# Patient Record
Sex: Female | Born: 1985 | Race: Black or African American | Hispanic: No | Marital: Married | Smoking: Never smoker
Health system: Southern US, Community
[De-identification: ages and names within clinical notes are randomized; demographics above are authoritative.]

## PROBLEM LIST (undated history)

## (undated) ENCOUNTER — Inpatient Hospital Stay (HOSPITAL_COMMUNITY): Payer: Self-pay

## (undated) DIAGNOSIS — O24419 Gestational diabetes mellitus in pregnancy, unspecified control: Secondary | ICD-10-CM

## (undated) DIAGNOSIS — Z8632 Personal history of gestational diabetes: Secondary | ICD-10-CM

## (undated) HISTORY — PX: NO PAST SURGERIES: SHX2092

## (undated) HISTORY — DX: Gestational diabetes mellitus in pregnancy, unspecified control: O24.419

## (undated) HISTORY — DX: Personal history of gestational diabetes: Z86.32

---
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
## 2007-08-28 ENCOUNTER — Emergency Department (HOSPITAL_COMMUNITY): Admission: EM | Admit: 2007-08-28 | Discharge: 2007-08-28 | Payer: Self-pay | Admitting: Emergency Medicine

## 2010-12-03 ENCOUNTER — Emergency Department (HOSPITAL_COMMUNITY): Payer: No Typology Code available for payment source

## 2010-12-03 ENCOUNTER — Emergency Department (HOSPITAL_COMMUNITY)
Admission: EM | Admit: 2010-12-03 | Discharge: 2010-12-03 | Disposition: A | Discharge status: Home / Self Care | Payer: No Typology Code available for payment source | Attending: Emergency Medicine | Admitting: Emergency Medicine

## 2010-12-03 DIAGNOSIS — R51 Headache: Secondary | ICD-10-CM | POA: Insufficient documentation

## 2010-12-03 DIAGNOSIS — T1490XA Injury, unspecified, initial encounter: Secondary | ICD-10-CM | POA: Insufficient documentation

## 2010-12-03 DIAGNOSIS — M545 Low back pain, unspecified: Secondary | ICD-10-CM | POA: Insufficient documentation

## 2010-12-03 DIAGNOSIS — Y92410 Unspecified street and highway as the place of occurrence of the external cause: Secondary | ICD-10-CM | POA: Insufficient documentation

## 2011-07-10 LAB — MONONUCLEOSIS SCREEN: Mono Screen: POSITIVE — AB

## 2011-07-10 LAB — STREP A DNA PROBE: Group A Strep Probe: NEGATIVE

## 2013-10-01 NOTE — L&D Delivery Note (Signed)
Delivery Note At 9:02 PM a viable and healthy female was delivered via Vaginal, Spontaneous Delivery (Presentation: Left Occiput Anterior).  APGAR: , ; weight  .   Placenta status: Intact, Spontaneous.  Cord: 3 vessels with the following complications: None.   Nuchal cord x 1, loose, reduced  Anesthesia: Epidural  Episiotomy: None Lacerations: Labial Suture Repair: 4-0 Monocryl Est. Blood Loss (mL):  60   Mom to postpartum.  Baby to Couplet care / Skin to Skin.  [facility] CenterWILLIAMS,Phyllis [name], 9:23 PM

## 2014-01-18 ENCOUNTER — Inpatient Hospital Stay (HOSPITAL_COMMUNITY)
Admission: AD | Admit: 2014-01-18 | Discharge: 2014-01-18 | Disposition: A | Discharge status: Home / Self Care | Payer: 59 | Source: Ambulatory Visit | Attending: Obstetrics & Gynecology | Admitting: Obstetrics & Gynecology

## 2014-01-18 ENCOUNTER — Encounter (HOSPITAL_COMMUNITY): Payer: Self-pay

## 2014-01-18 ENCOUNTER — Inpatient Hospital Stay (HOSPITAL_COMMUNITY): Payer: 59

## 2014-01-18 DIAGNOSIS — O9989 Other specified diseases and conditions complicating pregnancy, childbirth and the puerperium: Secondary | ICD-10-CM

## 2014-01-18 DIAGNOSIS — Z87891 Personal history of nicotine dependence: Secondary | ICD-10-CM | POA: Insufficient documentation

## 2014-01-18 DIAGNOSIS — R109 Unspecified abdominal pain: Secondary | ICD-10-CM | POA: Insufficient documentation

## 2014-01-18 DIAGNOSIS — O99891 Other specified diseases and conditions complicating pregnancy: Secondary | ICD-10-CM | POA: Insufficient documentation

## 2014-01-18 DIAGNOSIS — O26899 Other specified pregnancy related conditions, unspecified trimester: Secondary | ICD-10-CM

## 2014-01-18 LAB — HCG, QUANTITATIVE, PREGNANCY: HCG, BETA CHAIN, QUANT, S: 250 m[IU]/mL — AB (ref <5)

## 2014-01-18 LAB — ABO/RH: ABO/RH(D): A POS

## 2014-01-18 LAB — CBC
HCT: 34.1 % — ABNORMAL LOW (ref 36.0–46.0)
HEMOGLOBIN: 11.2 g/dL — AB (ref 12.0–15.0)
MCH: 23.2 pg — ABNORMAL LOW (ref 26.0–34.0)
MCHC: 32.8 g/dL (ref 30.0–36.0)
MCV: 70.6 fL — ABNORMAL LOW (ref 78.0–100.0)
Platelets: 293 10*3/uL (ref 150–400)
RBC: 4.83 MIL/uL (ref 3.87–5.11)
RDW: 15.2 % (ref 11.5–15.5)
WBC: 9 10*3/uL (ref 4.0–10.5)

## 2014-01-18 LAB — POCT PREGNANCY, URINE: PREG TEST UR: POSITIVE — AB

## 2014-01-18 LAB — WET PREP, GENITAL
CLUE CELLS WET PREP: NONE SEEN
TRICH WET PREP: NONE SEEN
YEAST WET PREP: NONE SEEN

## 2014-01-18 NOTE — MAU Note (Signed)
Pt states had two positive pregnancy tests. Is here for confirmation. Denies pain or abnormal vaginal discharge. No bleeding. LMP-12/11/2013.

## 2014-01-18 NOTE — MAU Provider Note (Signed)
History     CSN: [id number]  Arrival date and time: 01/18/14 1205   First Provider Initiated Contact with Patient 01/18/14 1408      Chief Complaint  Patient presents with  . Pregnancy confirmation    HPI Phyllis [name] is a 28 y.o. G1P0 at G1P0 who presents to MAU today with complaint of lower abdominal cramping and +HPT. The patient denies vaginal bleeding, abnormal discharge, N/V/D or fever. LMP 12/11/13.   OB History   Grav Para Term Preterm Abortions TAB SAB Ect Mult Living   1               No past medical history on file.  No past surgical history on file.  No family history on file.  History  Substance Use Topics  . Smoking status: Not on file  . Smokeless tobacco: Not on file  . Alcohol Use: Not on file    Allergies: No Known Allergies  Prescriptions prior to admission  Medication Sig Dispense Refill  . ibuprofen (ADVIL,MOTRIN) 200 MG tablet Take 600 mg by mouth every 6 (six) hours as needed.        Review of Systems  Constitutional: Negative for fever and malaise/fatigue.  Gastrointestinal: Positive for abdominal pain. Negative for nausea, vomiting, diarrhea and constipation.  Genitourinary:       Neg - vaginal bleeding, discharge   Physical Exam   Blood pressure 118/84, pulse 87, temperature 99 F (37.2 C), temperature source Oral, resp. rate 16, height 4\' 11"  (1.499 m), weight 45.416 kg (100 lb 2 oz), last menstrual period 12/11/2013.  Physical Exam  Constitutional: She is oriented to person, place, and time. She appears well-developed and well-nourished. No distress.  HENT:  Head: Normocephalic and atraumatic.  Cardiovascular: Normal rate.   Respiratory: Effort normal.  GI: Soft. She exhibits no distension and no mass. There is no tenderness. There is no rebound and no guarding.  Genitourinary: No bleeding around the vagina. Vaginal discharge (scant thin white discharge noted) found.  Neurological: She is alert and oriented to person,  place, and time.  Skin: Skin is warm and dry. No erythema.  Psychiatric: She has a normal mood and affect.   Results for orders placed during the hospital encounter of 01/18/14 (from the past 24 hour(s))  POCT PREGNANCY, URINE     Status: Abnormal   Collection Time    01/18/14  1:03 PM      Result Value Ref Range   Preg Test, Ur POSITIVE (*) NEGATIVE  HCG, QUANTITATIVE, PREGNANCY     Status: Abnormal   Collection Time    01/18/14  2:05 PM      Result Value Ref Range   hCG, Beta Chain, Quant, S 250 (*) <5 mIU/mL  CBC     Status: Abnormal   Collection Time    01/18/14  2:05 PM      Result Value Ref Range   WBC 9.0  4.0 - 10.5 K/uL   RBC 4.83  3.87 - 5.11 MIL/uL   Hemoglobin 11.2 (*) 12.0 - 15.0 g/dL   HCT 40.934.1 (*) 81.136.0 - 91.446.0 %   MCV 70.6 (*) 78.0 - 100.0 fL   MCH 23.2 (*) 26.0 - 34.0 pg   MCHC 32.8  30.0 - 36.0 g/dL   RDW 78.215.2  95.611.5 - 21.315.5 %   Platelets 293  150 - 400 K/uL  WET PREP, GENITAL     Status: Abnormal   Collection Time    01/18/14  2:30 PM      Result Value Ref Range   Yeast Wet Prep HPF POC NONE SEEN  NONE SEEN   Trich, Wet Prep NONE SEEN  NONE SEEN   Clue Cells Wet Prep HPF POC NONE SEEN  NONE SEEN   WBC, Wet Prep HPF POC RARE (*) NONE SEEN    [place] Ob Comp Less 14 Wks  01/18/2014   CLINICAL DATA:  Lower abdominal pain.  EXAM: OBSTETRIC <14 WK US AND TRANSVAGINAL OB US  TECHNIQUE: Both transabdominal and transvaginal ultrasound examinations were performed for complete evaluation of the gestation as well as the maternal uterus, adnexal regions, and pelvic cul-de-sac. Transvaginal technique was performed to assess early pregnancy.  COMPARISON:  None.  FINDINGS: Intrauterine gestational sac: Visualized/normal in shape.  Yolk sac:  None visualized  Embryo:  None visualized  Maternal uterus/adnexae: Endometrium approximately 15 mm in thickness. No ovarian or adnexal masses. Ovaries are symmetric in size and echotexture. Trace free fluid in the cul-de-sac.  IMPRESSION: No  intrauterine pregnancy visualized. Differential considerations would include early intrauterine pregnancy too early to visualize, spontaneous abortion, or occult ectopic pregnancy. Recommend close clinical followup and serial quantitative beta HCGs and ultrasounds.   Electronically Signed   By: Charlett NoseKevin  Dover M.D.   On: 01/18/2014 13:55   [place] Ob Transvaginal  01/18/2014   CLINICAL DATA:  Lower abdominal pain.  EXAM: OBSTETRIC <14 WK US AND TRANSVAGINAL OB US  TECHNIQUE: Both transabdominal and transvaginal ultrasound examinations were performed for complete evaluation of the gestation as well as the maternal uterus, adnexal regions, and pelvic cul-de-sac. Transvaginal technique was performed to assess early pregnancy.  COMPARISON:  None.  FINDINGS: Intrauterine gestational sac: Visualized/normal in shape.  Yolk sac:  None visualized  Embryo:  None visualized  Maternal uterus/adnexae: Endometrium approximately 15 mm in thickness. No ovarian or adnexal masses. Ovaries are symmetric in size and echotexture. Trace free fluid in the cul-de-sac.  IMPRESSION: No intrauterine pregnancy visualized. Differential considerations would include early intrauterine pregnancy too early to visualize, spontaneous abortion, or occult ectopic pregnancy. Recommend close clinical followup and serial quantitative beta HCGs and ultrasounds.   Electronically Signed   By: Charlett NoseKevin  Dover M.D.   On: 01/18/2014 13:55     MAU Course  Procedures None  MDM +UPT UA, wet prep, GC/Chlamydia, CBC, ABO/Rh, quant hCG and US today  Assessment and Plan  A: IUGS at 6867w1d without YS or FP Abdominal pain in pregnancy, antepartum  P: Discharge home Bleeding/Ectopic precautions discussed Patient to return to MAU in 48 hours for follow-up labs. Even though patient is low risk for ectopic based on today's exam, she is unable to come for follow-up until after work Patient may return to MAU as needed sooner or if her condition were to change or  worsen  [name], PA-C  01/18/2014, 2:53 PM

## 2014-01-18 NOTE — Discharge Instructions (Signed)
Pregnancy - First Trimester  During sexual intercourse, millions of sperm go into the vagina. Only 1 sperm will penetrate and fertilize the female egg while it is in the Fallopian tube. One week later, the fertilized egg implants into the wall of the uterus. An embryo begins to develop into a baby. At 6 to 8 weeks, the eyes and face are formed and the heartbeat can be seen on ultrasound. At the end of 12 weeks (first trimester), all the baby's organs are formed. Now that you are pregnant, you will want to do everything you can to have a healthy baby. Two of the most important things are to get good prenatal care and follow your caregiver's instructions. Prenatal care is all the medical care you receive before the baby's birth. It is given to prevent, find, and treat problems during the pregnancy and childbirth.  PRENATAL EXAMS  · During prenatal visits, your weight, blood pressure, and urine are checked. This is done to make sure you are healthy and progressing normally during the pregnancy.  · A pregnant woman should gain 25 to 35 pounds during the pregnancy. However, if you are overweight or underweight, your caregiver will advise you regarding your weight.  · Your caregiver will ask and answer questions for you.  · Blood work, cervical cultures, other necessary tests, and a Pap test are done during your prenatal exams. These tests are done to check on your health and the probable health of your baby. Tests are strongly recommended and done for HIV with your permission. This is the virus that causes AIDS. These tests are done because medicines can be given to help prevent your baby from being born with this infection should you have been infected without knowing it. Blood work is also used to find out your blood type, previous infections, and follow your blood levels (hemoglobin).  · Low hemoglobin (anemia) is common during pregnancy. Iron and vitamins are given to help prevent this. Later in the pregnancy, blood  tests for diabetes will be done along with any other tests if any problems develop.  · You may need other tests to make sure you and the baby are doing well.  CHANGES DURING THE FIRST TRIMESTER   Your body goes through many changes during pregnancy. They vary from person to person. Talk to your caregiver about changes you notice and are concerned about. Changes can include:  · Your menstrual period stops.  · The egg and sperm carry the genes that determine what you look like. Genes from you and your partner are forming a baby. The female genes determine whether the baby is a boy or a girl.  · Your body increases in girth and you may feel bloated.  · Feeling sick to your stomach (nauseous) and throwing up (vomiting). If the vomiting is uncontrollable, call your caregiver.  · Your breasts will begin to enlarge and become tender.  · Your nipples may stick out more and become darker.  · The need to urinate more. Painful urination may mean you have a bladder infection.  · Tiring easily.  · Loss of appetite.  · Cravings for certain kinds of food.  · At first, you may gain or lose a couple of pounds.  · You may have changes in your emotions from day to day (excited to be pregnant or concerned something may go wrong with the pregnancy and baby).  · You may have more vivid and strange dreams.  HOME CARE INSTRUCTIONS   ·   It is very important to avoid all smoking, alcohol and non-prescribed drugs during your pregnancy. These affect the formation and growth of the baby. Avoid chemicals while pregnant to ensure the delivery of a healthy infant.  · Start your prenatal visits by the 12th week of pregnancy. They are usually scheduled monthly at first, then more often in the last 2 months before delivery. Keep your caregiver's appointments. Follow your caregiver's instructions regarding medicine use, blood and lab tests, exercise, and diet.  · During pregnancy, you are providing food for you and your baby. Eat regular, well-balanced  meals. Choose foods such as meat, fish, milk and other low fat dairy products, vegetables, fruits, and whole-grain breads and cereals. Your caregiver will tell you of the ideal weight gain.  · You can help morning sickness by keeping soda crackers at the bedside. Eat a couple before arising in the morning. You may want to use the crackers without salt on them.  · Eating 4 to 5 small meals rather than 3 large meals a day also may help the nausea and vomiting.  · Drinking liquids between meals instead of during meals also seems to help nausea and vomiting.  · A physical sexual relationship may be continued throughout pregnancy if there are no other problems. Problems may be early (premature) leaking of amniotic fluid from the membranes, vaginal bleeding, or belly (abdominal) pain.  · Exercise regularly if there are no restrictions. Check with your caregiver or physical therapist if you are unsure of the safety of some of your exercises. Greater weight gain will occur in the last 2 trimesters of pregnancy. Exercising will help:  · Control your weight.  · Keep you in shape.  · Prepare you for labor and delivery.  · Help you lose your pregnancy weight after you deliver your baby.  · Wear a good support or jogging bra for breast tenderness during pregnancy. This may help if worn during sleep too.  · Ask when prenatal classes are available. Begin classes when they are offered.  · Do not use hot tubs, steam rooms, or saunas.  · Wear your seat belt when driving. This protects you and your baby if you are in an accident.  · Avoid raw meat, uncooked cheese, cat litter boxes, and soil used by cats throughout the pregnancy. These carry germs that can cause birth defects in the baby.  · The first trimester is a good time to visit your dentist for your dental health. Getting your teeth cleaned is okay. Use a softer toothbrush and brush gently during pregnancy.  · Ask for help if you have financial, counseling, or nutritional needs  during pregnancy. Your caregiver will be able to offer counseling for these needs as well as refer you for other special needs.  · Do not take any medicines or herbs unless told by your caregiver.  · Inform your caregiver if there is any mental or physical domestic violence.  · Make a list of emergency phone numbers of family, friends, hospital, and police and fire departments.  · Write down your questions. Take them to your prenatal visit.  · Do not douche.  · Do not cross your legs.  · If you have to stand for long periods of time, rotate you feet or take small steps in a circle.  · You may have more vaginal secretions that may require a sanitary pad. Do not use tampons or scented sanitary pads.  MEDICINES AND DRUG USE IN PREGNANCY  ·   Take prenatal vitamins as directed. The vitamin should contain 1 milligram of folic acid. Keep all vitamins out of reach of children. Only a couple vitamins or tablets containing iron may be fatal to a baby or young child when ingested.  · Avoid use of all medicines, including herbs, over-the-counter medicines, not prescribed or suggested by your caregiver. Only take over-the-counter or prescription medicines for pain, discomfort, or fever as directed by your caregiver. Do not use aspirin, ibuprofen, or naproxen unless directed by your caregiver.  · Let your caregiver also know about herbs you may be using.  · Alcohol is related to a number of birth defects. This includes fetal alcohol syndrome. All alcohol, in any form, should be avoided completely. Smoking will cause low birth rate and premature babies.  · Street or illegal drugs are very harmful to the baby. They are absolutely forbidden. A baby born to an addicted mother will be addicted at birth. The baby will go through the same withdrawal an adult does.  · Let your caregiver know about any medicines that you have to take and for what reason you take them.  SEEK MEDICAL CARE IF:   You have any concerns or worries during your  pregnancy. It is better to call with your questions if you feel they cannot wait, rather than worry about them.  SEEK IMMEDIATE MEDICAL CARE IF:   · An unexplained oral temperature above 102° F (38.9° C) develops, or as your caregiver suggests.  · You have leaking of fluid from the vagina (birth canal). If leaking membranes are suspected, take your temperature and inform your caregiver of this when you call.  · There is vaginal spotting or bleeding. Notify your caregiver of the amount and how many pads are used.  · You develop a bad smelling vaginal discharge with a change in the color.  · You continue to feel sick to your stomach (nauseated) and have no relief from remedies suggested. You vomit blood or coffee ground-like materials.  · You lose more than 2 pounds of weight in 1 week.  · You gain more than 2 pounds of weight in 1 week and you notice swelling of your face, hands, feet, or legs.  · You gain 5 pounds or more in 1 week (even if you do not have swelling of your hands, face, legs, or feet).  · You get exposed to German measles and have never had them.  · You are exposed to fifth disease or chickenpox.  · You develop belly (abdominal) pain. Round ligament discomfort is a common non-cancerous (benign) cause of abdominal pain in pregnancy. Your caregiver still must evaluate this.  · You develop headache, fever, diarrhea, pain with urination, or shortness of breath.  · You fall or are in a car accident or have any kind of trauma.  · There is mental or physical violence in your home.  Document Released: 09/11/2001 Document Revised: 06/11/2012 Document Reviewed: 03/15/2009  ExitCare® Patient Information ©2014 [employer].

## 2014-01-19 ENCOUNTER — Telehealth: Payer: Self-pay | Admitting: General Practice

## 2014-01-19 DIAGNOSIS — O98811 Other maternal infectious and parasitic diseases complicating pregnancy, first trimester: Principal | ICD-10-CM

## 2014-01-19 DIAGNOSIS — A749 Chlamydial infection, unspecified: Secondary | ICD-10-CM

## 2014-01-19 LAB — GC/CHLAMYDIA PROBE AMP
CT Probe RNA: POSITIVE — AB
GC Probe RNA: NEGATIVE

## 2014-01-19 MED ORDER — AZITHROMYCIN 250 MG PO TABS: 1000.0000 mg | ORAL_TABLET | Freq: Once | ORAL | Status: DC

## 2014-01-19 NOTE — Telephone Encounter (Signed)
Message copied by Kathee DeltonHILLMAN, CARRIE L on Tue Jan 19, 2014 12:05 PM ------      Message from: Pennie BanterSMITH, MARNI W      Created: Tue Jan 19, 2014 12:01 PM       Patient returned call, notified her of positive chlamydia culture.  Patient has not been treated and [facility] [address]  Instructed patient to notify her partner for treatment. ------

## 2014-01-19 NOTE — Telephone Encounter (Signed)
Called patient and informed her of medication available to pick [facility] & importance of partner getting treated as well. Patient verbalized understanding and had no further questions

## 2014-01-20 ENCOUNTER — Inpatient Hospital Stay (HOSPITAL_COMMUNITY)
Admission: AD | Admit: 2014-01-20 | Discharge: 2014-01-20 | Disposition: A | Discharge status: Home / Self Care | Payer: 59 | Source: Ambulatory Visit | Attending: Obstetrics and Gynecology | Admitting: Obstetrics and Gynecology

## 2014-01-20 ENCOUNTER — Encounter (HOSPITAL_COMMUNITY): Payer: Self-pay | Admitting: *Deleted

## 2014-01-20 DIAGNOSIS — O99891 Other specified diseases and conditions complicating pregnancy: Secondary | ICD-10-CM | POA: Insufficient documentation

## 2014-01-20 DIAGNOSIS — O26899 Other specified pregnancy related conditions, unspecified trimester: Secondary | ICD-10-CM

## 2014-01-20 DIAGNOSIS — R109 Unspecified abdominal pain: Secondary | ICD-10-CM

## 2014-01-20 DIAGNOSIS — O9989 Other specified diseases and conditions complicating pregnancy, childbirth and the puerperium: Secondary | ICD-10-CM

## 2014-01-20 LAB — HCG, QUANTITATIVE, PREGNANCY: hCG, Beta Chain, Quant, S: 729 m[IU]/mL — ABNORMAL HIGH (ref <5)

## 2014-01-20 NOTE — Discharge Instructions (Signed)
Pregnancy, First Trimester °The first trimester is the first 3 months your baby is growing inside you. It is important to follow your doctor's instructions. °HOME CARE  °· Do not smoke. °· Do not drink alcohol. °· Only take medicine as told by your doctor. °· Exercise. °· Eat healthy foods. Eat regular, well-balanced meals. °· You can have sex (intercourse) if there are no other problems with the pregnancy. °· Things that help with morning sickness: °· Eat soda crackers before getting up in the morning. °· Eat 4 to 5 small meals rather than 3 large meals. °· Drink liquids between meals, not during meals. °· Go to all appointments as told. °· Take all vitamins or supplements as told by your doctor. °GET HELP RIGHT AWAY IF:  °· You develop a fever. °· You have a bad smelling fluid that is leaking from your vagina. °· There is bleeding from the vagina. °· You develop severe belly (abdominal) or back pain. °· You throw up (vomit) blood. It may look like coffee grounds. °· You lose more than 2 pounds in a week. °· You gain 5 pounds or more in a week. °· You gain more than 2 pounds in a week and you see puffiness (swelling) in your feet, ankles, or legs. °· You have severe dizziness or pass out (faint). °· You are around people who have German measles, chickenpox, or fifth disease. °· You have a headache, watery poop (diarrhea), pain with peeing (urinating), or cannot breath right. °Document Released: 03/05/2008 Document Revised: 12/10/2011 Document Reviewed: 03/05/2008 °ExitCare® Patient Information ©2014 [employer]. ° °

## 2014-01-20 NOTE — MAU Provider Note (Signed)
Ms. Phyllis [name] is a 28 y.o. G1P0 at 6350w5d who presents to MAU today for 48 hour follow-up quant hCG. The patient states mild, occasional cramping that is improved from last visit. She denies vaginal bleeding. She was recently treated for Chlamydia diagnosed after last visit in MAU. Patient states partner treated as well.   BP 104/62  Pulse 93  Temp(Src) 98.4 F (36.9 C) (Oral)  Resp 16  Ht 5' (1.524 m)  Wt 102 lb 6.4 oz (46.448 kg)  BMI 20.00 kg/m2  LMP 12/11/2013 GENERAL: Well-developed, well-nourished female in no acute distress.  HEENT: Normocephalic, atraumatic.   LUNGS: Effort normal HEART: Regular rate  SKIN: Warm, dry and without erythema PSYCH: Normal mood and affect  Results for [name], Phyllis [name] (MRN [id number]) as of 01/20/2014 20:09  Ref. Range 01/18/2014 14:05 01/18/2014 14:30 01/20/2014 19:12  hCG, Beta Chain, Quant, [name] Latest Range: <5 mIU/mL 250 (H)  729 (H)    A: Appropriate rise in quant hCG  P: Discharge home Bleeding/ectopic precautions discussed Follow-up US in 1 week ordered. They will call the patient with an appointment Patient may return to MAU as needed sooner of if her condition were to change or worsen  [name], PA-C 01/20/2014 8:09 PM

## 2014-01-20 NOTE — MAU Note (Signed)
Pt in for repeat BHCG.  Mild cramping at times.  Denies bleeding.  Pt took medication for chlamydia last night.  Partner was also treated.

## 2014-01-20 NOTE — MAU Note (Signed)
Pt D/C home per MAU provider.

## 2014-01-28 NOTE — MAU Provider Note (Signed)
Attestation of Attending Supervision of Advanced Practitioner (CNM/NP): Evaluation and management procedures were performed by the Advanced Practitioner under my supervision and collaboration. I have reviewed the Advanced Practitioner's note and chart, and I agree with the management and plan.  Lesly DukesKelly H Leggett 5:44 PM

## 2014-01-31 NOTE — MAU Provider Note (Signed)
Attestation of Attending Supervision of Advanced Practitioner: Evaluation and management procedures were performed by the PA/NP/CNM/OB Fellow under my supervision/collaboration. Chart reviewed and agree with management and plan.  Tilda BurrowJohn V Sherryn Pollino 01/31/2014 12:33 AM

## 2014-02-01 ENCOUNTER — Ambulatory Visit (HOSPITAL_COMMUNITY): Payer: No Typology Code available for payment source

## 2014-02-01 ENCOUNTER — Ambulatory Visit (HOSPITAL_COMMUNITY)
Admission: RE | Admit: 2014-02-01 | Discharge: 2014-02-01 | Disposition: A | Discharge status: Home / Self Care | Payer: 59 | Source: Ambulatory Visit | Attending: Medical | Admitting: Medical

## 2014-02-01 DIAGNOSIS — O99891 Other specified diseases and conditions complicating pregnancy: Secondary | ICD-10-CM | POA: Insufficient documentation

## 2014-02-01 DIAGNOSIS — R109 Unspecified abdominal pain: Secondary | ICD-10-CM

## 2014-02-01 DIAGNOSIS — O9989 Other specified diseases and conditions complicating pregnancy, childbirth and the puerperium: Principal | ICD-10-CM

## 2014-02-01 DIAGNOSIS — O26899 Other specified pregnancy related conditions, unspecified trimester: Secondary | ICD-10-CM

## 2014-02-01 DIAGNOSIS — Z3689 Encounter for other specified antenatal screening: Secondary | ICD-10-CM | POA: Insufficient documentation

## 2014-02-01 DIAGNOSIS — O0930 Supervision of pregnancy with insufficient antenatal care, unspecified trimester: Secondary | ICD-10-CM | POA: Insufficient documentation

## 2014-02-02 ENCOUNTER — Telehealth: Payer: Self-pay | Admitting: Medical

## 2014-02-02 NOTE — Telephone Encounter (Signed)
LM for patient to return call to MAU. Patient had IUP on US. Needs to be informed of normal results. Can start prenatal care with OB provider of choice. List of area OB providers given at last visit  [name], PA-C 02/02/2014 2:27 PM

## 2014-02-02 NOTE — Telephone Encounter (Signed)
Patient returned call to MAU. US results discussed. Patient advised to start prenatal care with provider of choice. Patient voiced understanding.   [name], PA-C 02/02/2014 7:30 PM

## 2014-03-13 ENCOUNTER — Encounter (HOSPITAL_COMMUNITY): Payer: Self-pay | Admitting: *Deleted

## 2014-03-13 ENCOUNTER — Inpatient Hospital Stay (HOSPITAL_COMMUNITY)
Admission: AD | Admit: 2014-03-13 | Discharge: 2014-03-13 | Disposition: A | Discharge status: Home / Self Care | Payer: BC Managed Care – PPO | Source: Ambulatory Visit | Attending: Obstetrics & Gynecology | Admitting: Obstetrics & Gynecology

## 2014-03-13 DIAGNOSIS — K5289 Other specified noninfective gastroenteritis and colitis: Secondary | ICD-10-CM | POA: Insufficient documentation

## 2014-03-13 DIAGNOSIS — O210 Mild hyperemesis gravidarum: Secondary | ICD-10-CM | POA: Insufficient documentation

## 2014-03-13 DIAGNOSIS — A084 Viral intestinal infection, unspecified: Secondary | ICD-10-CM

## 2014-03-13 DIAGNOSIS — O99891 Other specified diseases and conditions complicating pregnancy: Secondary | ICD-10-CM | POA: Insufficient documentation

## 2014-03-13 DIAGNOSIS — A088 Other specified intestinal infections: Secondary | ICD-10-CM

## 2014-03-13 DIAGNOSIS — O9989 Other specified diseases and conditions complicating pregnancy, childbirth and the puerperium: Principal | ICD-10-CM

## 2014-03-13 LAB — URINALYSIS, ROUTINE W REFLEX MICROSCOPIC
Bilirubin Urine: NEGATIVE
GLUCOSE, UA: NEGATIVE mg/dL
HGB URINE DIPSTICK: NEGATIVE
KETONES UR: 15 mg/dL — AB
Nitrite: NEGATIVE
PH: 6 (ref 5.0–8.0)
Protein, ur: NEGATIVE mg/dL
Specific Gravity, Urine: 1.02 (ref 1.005–1.030)
Urobilinogen, UA: 0.2 mg/dL (ref 0.0–1.0)

## 2014-03-13 LAB — URINE MICROSCOPIC-ADD ON

## 2014-03-13 MED ORDER — ONDANSETRON 8 MG PO TBDP
8.0000 mg | ORAL_TABLET | Freq: Once | ORAL | Status: AC
  Administered 2014-03-13: 8 mg via ORAL
  Filled 2014-03-13: qty 1

## 2014-03-13 MED ORDER — ONDANSETRON 8 MG PO TBDP: 8.0000 mg | ORAL_TABLET | Freq: Three times a day (TID) | ORAL | Status: DC | PRN

## 2014-03-13 MED ORDER — LACTATED RINGERS IV SOLN
25.0000 mg | Freq: Once | INTRAVENOUS | Status: AC
  Administered 2014-03-13: 25 mg via INTRAVENOUS
  Filled 2014-03-13: qty 1

## 2014-03-13 MED ORDER — PROMETHAZINE HCL 12.5 MG PO TABS: 12.5000 mg | ORAL_TABLET | Freq: Four times a day (QID) | ORAL | Status: DC | PRN

## 2014-03-13 NOTE — MAU Note (Signed)
Pt reports she started vomiting about 7pm tonight. Has had some n/v with pregnancy but not this bad. Stated her daughter had diarrhea yesterday.

## 2014-03-13 NOTE — Discharge Instructions (Signed)
Diet  °Frequent, runny stools (diarrhea) may be caused or worsened by food or drink. Diarrhea may be relieved by changing your diet. Since diarrhea can last up to 7 days, it is easy for you to lose too much fluid from the body and become dehydrated. Fluids that are lost need to be replaced. Along with a modified diet, make sure you drink enough fluids to keep your urine clear or pale yellow. °DIET INSTRUCTIONS °· Ensure adequate fluid intake (hydration): have 1 cup (8 oz) of fluid for each diarrhea episode. Avoid fluids that contain simple sugars or sports drinks, fruit juices, whole milk products, and sodas. Your urine should be clear or pale yellow if you are drinking enough fluids. Hydrate with an oral rehydration solution that you can purchase at pharmacies, retail stores, and online. You can prepare an oral rehydration solution at home by mixing the following ingredients together: °·   tsp table salt. °· ¾ tsp baking soda. °·  tsp salt substitute containing potassium chloride. °· 1  tablespoons sugar. °· 1 L (34 oz) of water. °· Certain foods and beverages may increase the speed at which food moves through the gastrointestinal (GI) tract. These foods and beverages should be avoided and include: °· Caffeinated and alcoholic beverages. °· High-fiber foods, such as raw fruits and vegetables, nuts, seeds, and whole grain breads and cereals. °· Foods and beverages sweetened with sugar alcohols, such as xylitol, sorbitol, and mannitol. °· Some foods may be well tolerated and may help thicken stool including: °· Starchy foods, such as rice, toast, pasta, low-sugar cereal, oatmeal, grits, baked potatoes, crackers, and bagels.   °· Bananas.   °· Applesauce. °· Add probiotic-rich foods to help increase healthy bacteria in the GI tract, such as yogurt and fermented milk products. °RECOMMENDED FOODS AND BEVERAGES °Starches °Choose foods with less than 2 g of fiber per serving. °· Recommended:  White, French, and pita  breads, plain rolls, buns, bagels. Plain muffins, matzo. Soda, saltine, or graham crackers. Pretzels, melba toast, zwieback. Cooked cereals made with water: cornmeal, farina, cream cereals. Dry cereals: refined corn, wheat, rice. Potatoes prepared any way without skins, refined macaroni, spaghetti, noodles, refined rice. °· Avoid:  Bread, rolls, or crackers made with whole wheat, multi-grains, rye, bran seeds, nuts, or coconut. Corn tortillas or taco shells. Cereals containing whole grains, multi-grains, bran, coconut, nuts, raisins. Cooked or dry oatmeal. Coarse wheat cereals, granola. Cereals advertised as "high-fiber." Potato skins. Whole grain pasta, wild or brown rice. Popcorn. Sweet potatoes, yams. Sweet rolls, doughnuts, waffles, pancakes, sweet breads. °Vegetables °· Recommended: Strained tomato and vegetable juices. Most well-cooked and canned vegetables without seeds. Fresh: Tender lettuce, cucumber without the skin, cabbage, spinach, bean sprouts. °· Avoid: Fresh, cooked, or canned: Artichokes, baked beans, beet greens, broccoli, Brussels sprouts, corn, kale, legumes, peas, sweet potatoes. Cooked: Green or red cabbage, spinach. Avoid large servings of any vegetables because vegetables shrink when cooked, and they contain more fiber per serving than fresh vegetables. °Fruit °· Recommended: Cooked or canned: Apricots, applesauce, cantaloupe, cherries, fruit cocktail, grapefruit, grapes, kiwi, mandarin oranges, peaches, pears, plums, watermelon. Fresh: Apples without skin, ripe banana, grapes, cantaloupe, cherries, grapefruit, peaches, oranges, plums. Keep servings limited to ½ cup or 1 piece. °· Avoid: Fresh: Apples with skin, apricots, mangoes, pears, raspberries, strawberries. Prune juice, stewed or dried prunes. Dried fruits, raisins, dates. Large servings of all fresh fruits. °Protein °· Recommended: Ground or well-cooked tender beef, ham, veal, lamb, pork, or poultry. Eggs. Fish, oysters, shrimp,  lobster, other   seafoods. Liver, organ meats. °· Avoid: Tough, fibrous meats with gristle. Peanut butter, smooth or chunky. Cheese, nuts, seeds, legumes, dried peas, beans, lentils. °Dairy °· Recommended: Yogurt, lactose-free milk, kefir, drinkable yogurt, buttermilk, soy milk, or plain hard cheese. °· Avoid: Milk, chocolate milk, beverages made with milk, such as milkshakes. °Soups °· Recommended: Bouillon, broth, or soups made from allowed foods. Any strained soup. °· Avoid: Soups made from vegetables that are not allowed, cream or milk-based soups. °Desserts and Sweets °· Recommended: Sugar-free gelatin, sugar-free frozen ice pops made without sugar alcohol. °· Avoid: Plain cakes and cookies, pie made with fruit, pudding, custard, cream pie. Gelatin, fruit, ice, sherbet, frozen ice pops. Ice cream, ice milk without nuts. Plain hard candy, honey, jelly, molasses, syrup, sugar, chocolate syrup, gumdrops, marshmallows. °Fats and Oils °· Recommended: Limit fats to less than 8 tsp per day. °· Avoid: Seeds, nuts, olives, avocados. Margarine, butter, cream, mayonnaise, salad oils, plain salad dressings. Plain gravy, crisp bacon without rind. °Beverages °· Recommended: Water, decaffeinated teas, oral rehydration solutions, sugar-free beverages not sweetened with sugar alcohols. °· Avoid: Fruit juices, caffeinated beverages (coffee, tea, soda), alcohol, sports drinks, or lemon-lime soda. °Condiments °· Recommended: Ketchup, mustard, horseradish, vinegar, cocoa powder. Spices in moderation: allspice, basil, bay leaves, celery powder or leaves, cinnamon, cumin powder, curry powder, ginger, mace, marjoram, onion or garlic powder, oregano, paprika, parsley flakes, ground pepper, rosemary, sage, savory, tarragon, thyme, turmeric. °· Avoid: Coconut, honey. °Document Released: 12/08/2003 Document Revised: 06/11/2012 Document Reviewed: 02/01/2012 °ExitCare® Patient Information ©2014 [employer]. ° °

## 2014-03-13 NOTE — MAU Provider Note (Signed)
History     CSN: [id number]  Arrival date and time: 03/13/14 0010   First Provider Initiated Contact with Patient 03/13/14 0043      Chief Complaint  Patient presents with  . Emesis   HPI Phyllis [name] is a 28 y.o. G1P0 at 4887w1d who presents to MAU today with complaint of N/V that started acutely at 1900 today. She denies diarrhea, abdominal pain or fever. She states that her daughter was recently sick with diarrhea. She denies vaginal bleeding today.    OB History   Grav Para Term Preterm Abortions TAB SAB Ect Mult Living   1               Past Medical History  Diagnosis Date  . Medical history non-contributory     Past Surgical History  Procedure Laterality Date  . No past surgeries      History reviewed. No pertinent family history.  History  Substance Use Topics  . Smoking status: Never Smoker   . Smokeless tobacco: Never Used  . Alcohol Use: No    Allergies: No Known Allergies  Prescriptions prior to admission  Medication Sig Dispense Refill  . azithromycin (ZITHROMAX) 250 MG tablet Take 4 tablets (1,000 mg total) by mouth once.  4 tablet  0    Review of Systems  Constitutional: Negative for fever and malaise/fatigue.  Gastrointestinal: Positive for nausea and vomiting. Negative for abdominal pain, diarrhea and constipation.  Genitourinary: Negative for dysuria, urgency and frequency.       Neg - vaginal bleeding   Physical Exam   Blood pressure 100/70, pulse 102, temperature 98.4 F (36.9 C), temperature source Oral, resp. rate 16, height 5' (1.524 m), last menstrual period 12/11/2013.  Physical Exam  Constitutional: She is oriented to person, place, and time. She appears well-developed and well-nourished. No distress.  HENT:  Head: Normocephalic and atraumatic.  Cardiovascular: Normal rate.   Respiratory: Effort normal.  GI: Soft. She exhibits no distension and no mass. There is no tenderness. There is no rebound and no guarding.   Neurological: She is alert and oriented to person, place, and time.  Skin: Skin is warm and dry. No erythema.  Psychiatric: She has a normal mood and affect.   Results for orders placed during the hospital encounter of 03/13/14 (from the past 24 hour(s))  URINALYSIS, ROUTINE W REFLEX MICROSCOPIC     Status: Abnormal   Collection Time    03/13/14  2:15 AM      Result Value Ref Range   Color, Urine YELLOW  YELLOW   APPearance CLEAR  CLEAR   Specific Gravity, Urine 1.020  1.005 - 1.030   pH 6.0  5.0 - 8.0   Glucose, UA NEGATIVE  NEGATIVE mg/dL   Hgb urine dipstick NEGATIVE  NEGATIVE   Bilirubin Urine NEGATIVE  NEGATIVE   Ketones, ur 15 (*) NEGATIVE mg/dL   Protein, ur NEGATIVE  NEGATIVE mg/dL   Urobilinogen, UA 0.2  0.0 - 1.0 mg/dL   Nitrite NEGATIVE  NEGATIVE   Leukocytes, UA MODERATE (*) NEGATIVE  URINE MICROSCOPIC-ADD ON     Status: Abnormal   Collection Time    03/13/14  2:15 AM      Result Value Ref Range   Squamous Epithelial / LPF RARE  RARE   WBC, UA 7-10  <3 WBC/hpf   RBC / HPF 0-2  <3 RBC/hpf   Bacteria, UA FEW (*) RARE    MAU Course  Procedures None  MDM Unable to give urine sample today IV LR with Phenergan infusion given in MAU Patient reports significant improvement in symptoms FHR - 170 bpm with doppler Patient was being discharge when she started vomiting again ODT Zofran given Assessment and Plan  A: Viral gastroenteritis, acute  P: Discharge home Rx for Phenergan and Zofran given to patient Patient advised to increase PO hydration and advance diet as tolerated Patient advised to follow-up with OB provider of choice Patient may return to MAU as needed or if her condition were to change or worsen  [name], PA-C  03/13/2014, 3:32 AM

## 2014-03-13 NOTE — MAU Provider Note (Signed)
Attestation of Attending Supervision of Advanced Practitioner (PA/CNM/NP): Evaluation and management procedures were performed by the Advanced Practitioner under my supervision and collaboration.  I have reviewed the Advanced Practitioner's note and chart, and I agree with the management and plan.  Kollyn Lingafelter, MD, FACOG Attending Obstetrician & Gynecologist Faculty Practice, [facility]  

## 2014-03-22 ENCOUNTER — Encounter: Payer: Self-pay | Admitting: Advanced Practice Midwife

## 2014-03-22 ENCOUNTER — Ambulatory Visit (INDEPENDENT_AMBULATORY_CARE_PROVIDER_SITE_OTHER): Payer: BC Managed Care – PPO | Admitting: Advanced Practice Midwife

## 2014-03-22 VITALS — BP 100/63 | HR 88 | Wt 99.0 lb

## 2014-03-22 DIAGNOSIS — B3731 Acute candidiasis of vulva and vagina: Secondary | ICD-10-CM

## 2014-03-22 DIAGNOSIS — A749 Chlamydial infection, unspecified: Secondary | ICD-10-CM

## 2014-03-22 DIAGNOSIS — O98811 Other maternal infectious and parasitic diseases complicating pregnancy, first trimester: Secondary | ICD-10-CM

## 2014-03-22 DIAGNOSIS — B373 Candidiasis of vulva and vagina: Secondary | ICD-10-CM

## 2014-03-22 DIAGNOSIS — Z124 Encounter for screening for malignant neoplasm of cervix: Secondary | ICD-10-CM

## 2014-03-22 DIAGNOSIS — Z3491 Encounter for supervision of normal pregnancy, unspecified, first trimester: Secondary | ICD-10-CM

## 2014-03-22 DIAGNOSIS — Z113 Encounter for screening for infections with a predominantly sexual mode of transmission: Secondary | ICD-10-CM

## 2014-03-22 DIAGNOSIS — O98519 Other viral diseases complicating pregnancy, unspecified trimester: Secondary | ICD-10-CM

## 2014-03-22 DIAGNOSIS — Z3400 Encounter for supervision of normal first pregnancy, unspecified trimester: Secondary | ICD-10-CM

## 2014-03-22 MED ORDER — TERCONAZOLE 0.4 % VA CREA: 1.0000 | TOPICAL_CREAM | Freq: Every day | VAGINAL | Status: DC

## 2014-03-22 NOTE — Progress Notes (Signed)
Feels like she has a yeast infection  Last pap 1 year ago and never had a positive

## 2014-03-22 NOTE — Patient Instructions (Addendum)
Quadruple screen  Second Trimester of Pregnancy The second trimester is from week 13 through week 28, months 4 through 6. The second trimester is often a time when you feel your best. Your body has also adjusted to being pregnant, and you begin to feel better physically. Usually, morning sickness has lessened or quit completely, you may have more energy, and you may have an increase in appetite. The second trimester is also a time when the fetus is growing rapidly. At the end of the sixth month, the fetus is about 9 inches long and weighs about 1 pounds. You will likely begin to feel the baby move (quickening) between 18 and 20 weeks of the pregnancy. BODY CHANGES Your body goes through many changes during pregnancy. The changes vary from woman to woman.   Your weight will continue to increase. You will notice your lower abdomen bulging out.  You may begin to get stretch marks on your hips, abdomen, and breasts.  You may develop headaches that can be relieved by medicines approved by your health care provider.  You may urinate more often because the fetus is pressing on your bladder.  You may develop or continue to have heartburn as a result of your pregnancy.  You may develop constipation because certain hormones are causing the muscles that push waste through your intestines to slow down.  You may develop hemorrhoids or swollen, bulging veins (varicose veins).  You may have back pain because of the weight gain and pregnancy hormones relaxing your joints between the bones in your pelvis and as a result of a shift in weight and the muscles that support your balance.  Your breasts will continue to grow and be tender.  Your gums may bleed and may be sensitive to brushing and flossing.  Dark spots or blotches (chloasma, mask of pregnancy) may develop on your face. This will likely fade after the baby is born.  A dark line from your belly button to the pubic area (linea nigra) may appear.  This will likely fade after the baby is born.  You may have changes in your hair. These can include thickening of your hair, rapid growth, and changes in texture. Some women also have hair loss during or after pregnancy, or hair that feels dry or thin. Your hair will most likely return to normal after your baby is born. WHAT TO EXPECT AT YOUR PRENATAL VISITS During a routine prenatal visit:  You will be weighed to make sure you and the fetus are growing normally.  Your blood pressure will be taken.  Your abdomen will be measured to track your baby's growth.  The fetal heartbeat will be listened to.  Any test results from the previous visit will be discussed. Your health care provider may ask you:  How you are feeling.  If you are feeling the baby move.  If you have had any abnormal symptoms, such as leaking fluid, bleeding, severe headaches, or abdominal cramping.  If you have any questions. Other tests that may be performed during your second trimester include:  Blood tests that check for:  Low iron levels (anemia).  Gestational diabetes (between 24 and 28 weeks).  Rh antibodies.  Urine tests to check for infections, diabetes, or protein in the urine.  An ultrasound to confirm the proper growth and development of the baby.  An amniocentesis to check for possible genetic problems.  Fetal screens for spina bifida and Down syndrome. HOME CARE INSTRUCTIONS   Avoid all smoking, herbs,  alcohol, and unprescribed drugs. These chemicals affect the formation and growth of the baby.  Follow your health care provider's instructions regarding medicine use. There are medicines that are either safe or unsafe to take during pregnancy.  Exercise only as directed by your health care provider. Experiencing uterine cramps is a good sign to stop exercising.  Continue to eat regular, healthy meals.  Wear a good support bra for breast tenderness.  Do not use hot tubs, steam rooms, or  saunas.  Wear your seat belt at all times when driving.  Avoid raw meat, uncooked cheese, cat litter boxes, and soil used by cats. These carry germs that can cause birth defects in the baby.  Take your prenatal vitamins.  Try taking a stool softener (if your health care provider approves) if you develop constipation. Eat more high-fiber foods, such as fresh vegetables or fruit and whole grains. Drink plenty of fluids to keep your urine clear or pale yellow.  Take warm sitz baths to soothe any pain or discomfort caused by hemorrhoids. Use hemorrhoid cream if your health care provider approves.  If you develop varicose veins, wear support hose. Elevate your feet for 15 minutes, 3-4 times a day. Limit salt in your diet.  Avoid heavy lifting, wear low heel shoes, and practice good posture.  Rest with your legs elevated if you have leg cramps or low back pain.  Visit your dentist if you have not gone yet during your pregnancy. Use a soft toothbrush to brush your teeth and be gentle when you floss.  A sexual relationship may be continued unless your health care provider directs you otherwise.  Continue to go to all your prenatal visits as directed by your health care provider. SEEK MEDICAL CARE IF:   You have dizziness.  You have mild pelvic cramps, pelvic pressure, or nagging pain in the abdominal area.  You have persistent nausea, vomiting, or diarrhea.  You have a bad smelling vaginal discharge.  You have pain with urination. SEEK IMMEDIATE MEDICAL CARE IF:   You have a fever.  You are leaking fluid from your vagina.  You have spotting or bleeding from your vagina.  You have severe abdominal cramping or pain.  You have rapid weight gain or loss.  You have shortness of breath with chest pain.  You notice sudden or extreme swelling of your face, hands, ankles, feet, or legs.  You have not felt your baby move in over an hour.  You have severe headaches that do not go  away with medicine.  You have vision changes. Document Released: 09/11/2001 Document Revised: 09/22/2013 Document Reviewed: 11/18/2012 [facility] Patient Information 2015 [place], [place]. This information is not intended to replace advice given to you by your health care provider. Make sure you discuss any questions you have with your health care provider.  Monilial Vaginitis Vaginitis in a soreness, swelling and redness (inflammation) of the vagina and vulva. Monilial vaginitis is not a sexually transmitted infection. CAUSES  Yeast vaginitis is caused by yeast (candida) that is normally found in your vagina. With a yeast infection, the candida has overgrown in number to a point that upsets the chemical balance. SYMPTOMS   White, thick vaginal discharge.  Swelling, itching, redness and irritation of the vagina and possibly the lips of the vagina (vulva).  Burning or painful urination.  Painful intercourse. DIAGNOSIS  Things that may contribute to monilial vaginitis are:  Postmenopausal and virginal states.  Pregnancy.  Infections.  Being tired, sick or stressed,  especially if you had monilial vaginitis in the past.  Diabetes. Good control will help lower the chance.  Birth control pills.  Tight fitting garments.  Using bubble bath, feminine sprays, douches or deodorant tampons.  Taking certain medications that kill germs (antibiotics).  Sporadic recurrence can occur if you become ill. TREATMENT  Your caregiver will give you medication.  There are several kinds of anti monilial vaginal creams and suppositories specific for monilial vaginitis. For recurrent yeast infections, use a suppository or cream in the vagina 2 times a week, or as directed.  Anti-monilial or steroid cream for the itching or irritation of the vulva may also be used. Get your caregiver's permission.  Painting the vagina with methylene blue solution may help if the monilial cream does not  work.  Eating yogurt may help prevent monilial vaginitis. HOME CARE INSTRUCTIONS   Finish all medication as prescribed.  Do not have sex until treatment is completed or after your caregiver tells you it is okay.  Take warm sitz baths.  Do not douche.  Do not use tampons, especially scented ones.  Wear cotton underwear.  Avoid tight pants and panty hose.  Tell your sexual partner that you have a yeast infection. They should go to their caregiver if they have symptoms such as mild rash or itching.  Your sexual partner should be treated as well if your infection is difficult to eliminate.  Practice safer sex. Use condoms.  Some vaginal medications cause latex condoms to fail. Vaginal medications that harm condoms are:  Cleocin cream.  Butoconazole (Femstat).  Terconazole (Terazol) vaginal suppository.  Miconazole (Monistat) (may be purchased over the counter). SEEK MEDICAL CARE IF:   You have a temperature by mouth above 102 F (38.9 C).  The infection is getting worse after 2 days of treatment.  The infection is not getting better after 3 days of treatment.  You develop blisters in or around your vagina.  You develop vaginal bleeding, and it is not your menstrual period.  You have pain when you urinate.  You develop intestinal problems.  You have pain with sexual intercourse. Document Released: 06/27/2005 Document Revised: 12/10/2011 Document Reviewed: 03/11/2009 [facility] Patient Information 2015 [place], [place]. This information is not intended to replace advice given to you by your health care provider. Make sure you discuss any questions you have with your health care provider.

## 2014-03-22 NOTE — Progress Notes (Signed)
   Subjective:    Phyllis [name] is a G1P0 392w2d being seen today for her first obstetrical visit.  Her obstetrical history is significant for Chlamydia. Pt and husband Tx w/ Azithromycin.. Patient does intend to breast feed. Pregnancy history fully reviewed.  Patient reports no bleeding, no cramping, vaginal irritation and vaginal discharge, poor appetite. No N/V.  Filed Vitals:   03/22/14 1026  BP: 100/63  Pulse: 88  Weight: 99 lb (44.906 kg)    HISTORY: OB History  Gravida Para Term Preterm AB SAB TAB Ectopic Multiple Living  1             # Outcome Date GA Lbr Len/2nd Weight Sex Delivery Anes PTL Lv  1 CUR              Past Medical History  Diagnosis Date  . Medical history non-contributory    Past Surgical History  Procedure Laterality Date  . No past surgeries     Family History  Problem Relation Age of Onset  . Multiple sclerosis Mother   . Hypertension Paternal Aunt   . Cancer Maternal Grandmother     breast  . Cancer Maternal Aunt     breast     Exam    Uterus:   2/SP  Pelvic Exam:    Perineum: No Hemorrhoids, Normal Perineum   Vulva: normal   Vagina:  curdlike discharge, no blood   pH: NA   Cervix: no bleeding following Pap, no cervical motion tenderness and nulliparous appearance   Adnexa: no mass, fullness, tenderness   Bony Pelvis: average  System: Breast:  normal appearance, no masses or tenderness   Skin: normal coloration and turgor, no rashes    Neurologic: oriented, gait normal; reflexes normal and symmetric   Extremities: normal strength, tone, and muscle mass   HEENT sclera clear, anicteric   Mouth/Teeth dental hygiene good   Neck supple and no masses   Cardiovascular: regular rate and rhythm, no murmurs or gallops   Respiratory:  appears well, vitals normal, no respiratory distress, acyanotic, normal RR, chest clear, no wheezing, crepitations, rhonchi, normal symmetric air entry   Abdomen: soft, non-tender; bowel sounds normal; no  masses,  no organomegaly and Fundus 2/SP   Urinary: urethral meatus normal   + FHR by doppler.   Assessment:    Pregnancy: G1P0 Patient Active Problem List   Diagnosis Date Noted  . Supervision of normal pregnancy in first trimester 03/22/2014  Chlamydia in pregnancy      Plan:     Initial labs drawn. GC/ TOC Prenatal vitamins. Problem list reviewed and updated. Genetic Screening discussed Quad Screen: declined. Ultrasound discussed; fetal survey: requested. Follow up in 4 weeks. 75% of 40 min visit spent on counseling and coordination of care.  Terazol  Small, frequent nutrient-dense meals.  Dorathy KinsmanSMITH, [place] 03/22/2014

## 2014-03-23 ENCOUNTER — Telehealth: Payer: Self-pay | Admitting: *Deleted

## 2014-03-23 DIAGNOSIS — O98811 Other maternal infectious and parasitic diseases complicating pregnancy, first trimester: Secondary | ICD-10-CM

## 2014-03-23 DIAGNOSIS — A749 Chlamydial infection, unspecified: Secondary | ICD-10-CM | POA: Insufficient documentation

## 2014-03-23 LAB — OBSTETRIC PANEL
ANTIBODY SCREEN: NEGATIVE
BASOS ABS: 0 10*3/uL (ref 0.0–0.1)
BASOS PCT: 0 % (ref 0–1)
EOS PCT: 1 % (ref 0–5)
Eosinophils Absolute: 0.1 10*3/uL (ref 0.0–0.7)
HEMATOCRIT: 36.2 % (ref 36.0–46.0)
Hemoglobin: 11.1 g/dL — ABNORMAL LOW (ref 12.0–15.0)
Hepatitis B Surface Ag: NEGATIVE
Lymphocytes Relative: 14 % (ref 12–46)
Lymphs Abs: 1.2 10*3/uL (ref 0.7–4.0)
MCH: 22.7 pg — ABNORMAL LOW (ref 26.0–34.0)
MCHC: 30.7 g/dL (ref 30.0–36.0)
MCV: 73.9 fL — AB (ref 78.0–100.0)
MONO ABS: 0.5 10*3/uL (ref 0.1–1.0)
MONOS PCT: 6 % (ref 3–12)
Neutro Abs: 6.9 10*3/uL (ref 1.7–7.7)
Neutrophils Relative %: 79 % — ABNORMAL HIGH (ref 43–77)
Platelets: 308 10*3/uL (ref 150–400)
RBC: 4.9 MIL/uL (ref 3.87–5.11)
RDW: 16.1 % — ABNORMAL HIGH (ref 11.5–15.5)
RH TYPE: POSITIVE
RUBELLA: 2.65 {index} — AB (ref <0.90)
WBC: 8.7 10*3/uL (ref 4.0–10.5)

## 2014-03-23 LAB — SICKLE CELL SCREEN: SICKLE CELL SCREEN: NEGATIVE

## 2014-03-23 LAB — WET PREP FOR TRICH, YEAST, CLUE
Clue Cells Wet Prep HPF POC: NONE SEEN
Trich, Wet Prep: NONE SEEN

## 2014-03-23 LAB — HIV ANTIBODY (ROUTINE TESTING W REFLEX): HIV 1&2 Ab, 4th Generation: NONREACTIVE

## 2014-03-23 NOTE — Telephone Encounter (Signed)
Lm on voicemail of positive yeast on wet prep.  Pt has been given RX already.

## 2014-03-24 LAB — CULTURE, URINE COMPREHENSIVE
COLONY COUNT: NO GROWTH
Organism ID, Bacteria: NO GROWTH

## 2014-03-24 LAB — CYTOLOGY - PAP

## 2014-04-19 ENCOUNTER — Ambulatory Visit (INDEPENDENT_AMBULATORY_CARE_PROVIDER_SITE_OTHER): Payer: BC Managed Care – PPO | Admitting: Advanced Practice Midwife

## 2014-04-19 VITALS — BP 97/62 | HR 84 | Wt 103.0 lb

## 2014-04-19 DIAGNOSIS — Z3491 Encounter for supervision of normal pregnancy, unspecified, first trimester: Secondary | ICD-10-CM

## 2014-04-19 DIAGNOSIS — Z3480 Encounter for supervision of other normal pregnancy, unspecified trimester: Secondary | ICD-10-CM

## 2014-04-19 DIAGNOSIS — Z3492 Encounter for supervision of normal pregnancy, unspecified, second trimester: Secondary | ICD-10-CM

## 2014-04-19 MED ORDER — CONCEPT OB 130-92.4-1 MG PO CAPS: 1.0000 | ORAL_CAPSULE | Freq: Every day | ORAL | Status: DC

## 2014-04-19 NOTE — Progress Notes (Signed)
Needs to collect urine

## 2014-04-19 NOTE — Progress Notes (Signed)
Reviewed labs, RLP. Anatomy scan ordered.

## 2014-04-19 NOTE — Patient Instructions (Addendum)
Second Trimester of Pregnancy °The second trimester is from week 13 through week 28, months 4 through 6. The second trimester is often a time when you feel your best. Your body has also adjusted to being pregnant, and you begin to feel better physically. Usually, morning sickness has lessened or quit completely, you may have more energy, and you may have an increase in appetite. The second trimester is also a time when the fetus is growing rapidly. At the end of the sixth month, the fetus is about 9 inches long and weighs about 1½ pounds. You will likely begin to feel the baby move (quickening) between 18 and 20 weeks of the pregnancy. °BODY CHANGES °Your body goes through many changes during pregnancy. The changes vary from woman to woman.  °· Your weight will continue to increase. You will notice your lower abdomen bulging out. °· You may begin to get stretch marks on your hips, abdomen, and breasts. °· You may develop headaches that can be relieved by medicines approved by your health care provider. °· You may urinate more often because the fetus is pressing on your bladder. °· You may develop or continue to have heartburn as a result of your pregnancy. °· You may develop constipation because certain hormones are causing the muscles that push waste through your intestines to slow down. °· You may develop hemorrhoids or swollen, bulging veins (varicose veins). °· You may have back pain because of the weight gain and pregnancy hormones relaxing your joints between the bones in your pelvis and as a result of a shift in weight and the muscles that support your balance. °· Your breasts will continue to grow and be tender. °· Your gums may bleed and may be sensitive to brushing and flossing. °· Dark spots or blotches (chloasma, mask of pregnancy) may develop on your face. This will likely fade after the baby is born. °· A dark line from your belly button to the pubic area (linea nigra) may appear. This will likely fade  after the baby is born. °· You may have changes in your hair. These can include thickening of your hair, rapid growth, and changes in texture. Some women also have hair loss during or after pregnancy, or hair that feels dry or thin. Your hair will most likely return to normal after your baby is born. °WHAT TO EXPECT AT YOUR PRENATAL VISITS °During a routine prenatal visit: °· You will be weighed to make sure you and the fetus are growing normally. °· Your blood pressure will be taken. °· Your abdomen will be measured to track your baby's growth. °· The fetal heartbeat will be listened to. °· Any test results from the previous visit will be discussed. °Your health care provider may ask you: °· How you are feeling. °· If you are feeling the baby move. °· If you have had any abnormal symptoms, such as leaking fluid, bleeding, severe headaches, or abdominal cramping. °· If you have any questions. °Other tests that may be performed during your second trimester include: °· Blood tests that check for: °¨ Low iron levels (anemia). °¨ Gestational diabetes (between 24 and 28 weeks). °¨ Rh antibodies. °· Urine tests to check for infections, diabetes, or protein in the urine. °· An ultrasound to confirm the proper growth and development of the baby. °· An amniocentesis to check for possible genetic problems. °· Fetal screens for spina bifida and Down syndrome. °HOME CARE INSTRUCTIONS  °· Avoid all smoking, herbs, alcohol, and unprescribed   drugs. These chemicals affect the formation and growth of the baby. °· Follow your health care provider's instructions regarding medicine use. There are medicines that are either safe or unsafe to take during pregnancy. °· Exercise only as directed by your health care provider. Experiencing uterine cramps is a good sign to stop exercising. °· Continue to eat regular, healthy meals. °· Wear a good support bra for breast tenderness. °· Do not use hot tubs, steam rooms, or saunas. °· Wear your  seat belt at all times when driving. °· Avoid raw meat, uncooked cheese, cat litter boxes, and soil used by cats. These carry germs that can cause birth defects in the baby. °· Take your prenatal vitamins. °· Try taking a stool softener (if your health care provider approves) if you develop constipation. Eat more high-fiber foods, such as fresh vegetables or fruit and whole grains. Drink plenty of fluids to keep your urine clear or pale yellow. °· Take warm sitz baths to soothe any pain or discomfort caused by hemorrhoids. Use hemorrhoid cream if your health care provider approves. °· If you develop varicose veins, wear support hose. Elevate your feet for 15 minutes, 3-4 times a day. Limit salt in your diet. °· Avoid heavy lifting, wear low heel shoes, and practice good posture. °· Rest with your legs elevated if you have leg cramps or low back pain. °· Visit your dentist if you have not gone yet during your pregnancy. Use a soft toothbrush to brush your teeth and be gentle when you floss. °· A sexual relationship may be continued unless your health care provider directs you otherwise. °· Continue to go to all your prenatal visits as directed by your health care provider. °SEEK MEDICAL CARE IF:  °· You have dizziness. °· You have mild pelvic cramps, pelvic pressure, or nagging pain in the abdominal area. °· You have persistent nausea, vomiting, or diarrhea. °· You have a bad smelling vaginal discharge. °· You have pain with urination. °SEEK IMMEDIATE MEDICAL CARE IF:  °· You have a fever. °· You are leaking fluid from your vagina. °· You have spotting or bleeding from your vagina. °· You have severe abdominal cramping or pain. °· You have rapid weight gain or loss. °· You have shortness of breath with chest pain. °· You notice sudden or extreme swelling of your face, hands, ankles, feet, or legs. °· You have not felt your baby move in over an hour. °· You have severe headaches that do not go away with  medicine. °· You have vision changes. °Document Released: 09/11/2001 Document Revised: 09/22/2013 Document Reviewed: 11/18/2012 °Exit[employer]® Patient Information ©2015 [employer]. This information is not intended to replace advice given to you by your health care provider. Make sure you discuss any questions you have with your health care provider. ° ° °Iron-Rich Diet °An iron-rich diet contains foods that are good sources of iron. Iron is an important mineral that helps your body produce hemoglobin. Hemoglobin is a protein in red blood cells that carries oxygen to the body's tissues. Sometimes, the iron level in your blood can be low. This may be caused by: °· A lack of iron in your diet. °· Blood loss. °· Times of growth, such as during pregnancy or during a child's growth and development. °Low levels of iron can cause a decrease in the number of red blood cells. This can result in iron deficiency anemia. Iron deficiency anemia symptoms include: °· Tiredness. °· Weakness. °· Irritability. °· Increased chance   of infection. °Here are some recommendations for daily iron intake: °· Males older than 26 years of age need 8 mg of iron per day. °· Women ages 19 to 50 need 18 mg of iron per day. °· Pregnant women need 27 mg of iron per day, and women who are over 19 years of age and breastfeeding need 9 mg of iron per day. °· Women over the age of 50 need 8 mg of iron per day. °SOURCES OF IRON °There are 2 types of iron that are found in food: heme iron and nonheme iron. Heme iron is absorbed by the body better than nonheme iron. Heme iron is found in meat, poultry, and fish. Nonheme iron is found in grains, beans, and vegetables. °Heme Iron Sources °Food / Iron (mg) °· Chicken liver, 3 oz (85 g)/ 10 mg °· Beef liver, 3 oz (85 g)/ 5.5 mg °· Oysters, 3 oz (85 g)/ 8 mg °· Beef, 3 oz (85 g)/ 2 to 3 mg °· Shrimp, 3 oz (85 g)/ 2.8 mg °· Turkey, 3 oz (85 g)/ 2 mg °· Chicken, 3 oz (85 g) / 1 mg °· Fish (tuna, halibut), 3 oz (85  g)/ 1 mg °· Pork, 3 oz (85 g)/ 0.9 mg °Nonheme Iron Sources °Food / Iron (mg) °· Ready-to-eat breakfast cereal, iron-fortified / 3.9 to 7 mg °· Tofu, ½ cup / 3.4 mg °· Kidney beans, ½ cup / 2.6 mg °· Baked potato with skin / 2.7 mg °· Asparagus, ½ cup / 2.2 mg °· Avocado / 2 mg °· Dried peaches, ½ cup / 1.6 mg °· Raisins, ½ cup / 1.5 mg °· Soy milk, 1 cup / 1.5 mg °· Whole-wheat bread, 1 slice / 1.2 mg °· Spinach, 1 cup / 0.8 mg °· Broccoli, ½ cup / 0.6 mg °IRON ABSORPTION °Certain foods can decrease the body's absorption of iron. Try to avoid these foods and beverages while eating meals with iron-containing foods: °· Coffee. °· Tea. °· Fiber. °· Soy. °Foods containing vitamin C can help increase the amount of iron your body absorbs from iron sources, especially from nonheme sources. Eat foods with vitamin C along with iron-containing foods to increase your iron absorption. Foods that are high in vitamin C include many fruits and vegetables. Some good sources are: °· Fresh orange juice. °· Oranges. °· Strawberries. °· Mangoes. °· Grapefruit. °· Red bell peppers. °· Green bell peppers. °· Broccoli. °· Potatoes with skin. °· Tomato juice. °Document Released: 05/01/2005 Document Revised: 12/10/2011 Document Reviewed: 03/08/2011 °ExitCare® Patient Information ©2015 [employer]. This information is not intended to replace advice given to you by your health care provider. Make sure you discuss any questions you have with your health care provider. ° °

## 2014-05-05 ENCOUNTER — Ambulatory Visit (HOSPITAL_COMMUNITY)
Admission: RE | Admit: 2014-05-05 | Discharge: 2014-05-05 | Disposition: A | Discharge status: Home / Self Care | Payer: BC Managed Care – PPO | Source: Ambulatory Visit | Attending: Advanced Practice Midwife | Admitting: Advanced Practice Midwife

## 2014-05-05 DIAGNOSIS — Z3491 Encounter for supervision of normal pregnancy, unspecified, first trimester: Secondary | ICD-10-CM

## 2014-05-05 DIAGNOSIS — Z3689 Encounter for other specified antenatal screening: Secondary | ICD-10-CM | POA: Insufficient documentation

## 2014-05-07 ENCOUNTER — Encounter: Payer: Self-pay | Admitting: Family

## 2014-05-17 ENCOUNTER — Encounter: Payer: Self-pay | Admitting: Advanced Practice Midwife

## 2014-05-17 ENCOUNTER — Ambulatory Visit (INDEPENDENT_AMBULATORY_CARE_PROVIDER_SITE_OTHER): Payer: BC Managed Care – PPO | Admitting: Advanced Practice Midwife

## 2014-05-17 ENCOUNTER — Other Ambulatory Visit: Payer: Self-pay | Admitting: Internal Medicine

## 2014-05-17 VITALS — BP 108/63 | HR 97 | Wt 109.0 lb

## 2014-05-17 DIAGNOSIS — Z3480 Encounter for supervision of other normal pregnancy, unspecified trimester: Secondary | ICD-10-CM

## 2014-05-17 DIAGNOSIS — Z3492 Encounter for supervision of normal pregnancy, unspecified, second trimester: Secondary | ICD-10-CM

## 2014-05-17 NOTE — Patient Instructions (Signed)
Second Trimester of Pregnancy The second trimester is from week 13 through week 28, months 4 through 6. The second trimester is often a time when you feel your best. Your body has also adjusted to being pregnant, and you begin to feel better physically. Usually, morning sickness has lessened or quit completely, you may have more energy, and you may have an increase in appetite. The second trimester is also a time when the fetus is growing rapidly. At the end of the sixth month, the fetus is about 9 inches long and weighs about 1 pounds. You will likely begin to feel the baby move (quickening) between 18 and 20 weeks of the pregnancy. BODY CHANGES Your body goes through many changes during pregnancy. The changes vary from woman to woman.   Your weight will continue to increase. You will notice your lower abdomen bulging out.  You may begin to get stretch marks on your hips, abdomen, and breasts.  You may develop headaches that can be relieved by medicines approved by your health care provider.  You may urinate more often because the fetus is pressing on your bladder.  You may develop or continue to have heartburn as a result of your pregnancy.  You may develop constipation because certain hormones are causing the muscles that push waste through your intestines to slow down.  You may develop hemorrhoids or swollen, bulging veins (varicose veins).  You may have back pain because of the weight gain and pregnancy hormones relaxing your joints between the bones in your pelvis and as a result of a shift in weight and the muscles that support your balance.  Your breasts will continue to grow and be tender.  Your gums may bleed and may be sensitive to brushing and flossing.  Dark spots or blotches (chloasma, mask of pregnancy) may develop on your face. This will likely fade after the baby is born.  A dark line from your belly button to the pubic area (linea nigra) may appear. This will likely fade  after the baby is born.  You may have changes in your hair. These can include thickening of your hair, rapid growth, and changes in texture. Some women also have hair loss during or after pregnancy, or hair that feels dry or thin. Your hair will most likely return to normal after your baby is born. WHAT TO EXPECT AT YOUR PRENATAL VISITS During a routine prenatal visit:  You will be weighed to make sure you and the fetus are growing normally.  Your blood pressure will be taken.  Your abdomen will be measured to track your baby's growth.  The fetal heartbeat will be listened to.  Any test results from the previous visit will be discussed. Your health care provider may ask you:  How you are feeling.  If you are feeling the baby move.  If you have had any abnormal symptoms, such as leaking fluid, bleeding, severe headaches, or abdominal cramping.  If you have any questions. Other tests that may be performed during your second trimester include:  Blood tests that check for:  Low iron levels (anemia).  Gestational diabetes (between 24 and 28 weeks).  Rh antibodies.  Urine tests to check for infections, diabetes, or protein in the urine.  An ultrasound to confirm the proper growth and development of the baby.  An amniocentesis to check for possible genetic problems.  Fetal screens for spina bifida and Down syndrome. HOME CARE INSTRUCTIONS   Avoid all smoking, herbs, alcohol, and unprescribed   drugs. These chemicals affect the formation and growth of the baby.  Follow your health care provider's instructions regarding medicine use. There are medicines that are either safe or unsafe to take during pregnancy.  Exercise only as directed by your health care provider. Experiencing uterine cramps is a good sign to stop exercising.  Continue to eat regular, healthy meals.  Wear a good support bra for breast tenderness.  Do not use hot tubs, steam rooms, or saunas.  Wear your  seat belt at all times when driving.  Avoid raw meat, uncooked cheese, cat litter boxes, and soil used by cats. These carry germs that can cause birth defects in the baby.  Take your prenatal vitamins.  Try taking a stool softener (if your health care provider approves) if you develop constipation. Eat more high-fiber foods, such as fresh vegetables or fruit and whole grains. Drink plenty of fluids to keep your urine clear or pale yellow.  Take warm sitz baths to soothe any pain or discomfort caused by hemorrhoids. Use hemorrhoid cream if your health care provider approves.  If you develop varicose veins, wear support hose. Elevate your feet for 15 minutes, 3-4 times a day. Limit salt in your diet.  Avoid heavy lifting, wear low heel shoes, and practice good posture.  Rest with your legs elevated if you have leg cramps or low back pain.  Visit your dentist if you have not gone yet during your pregnancy. Use a soft toothbrush to brush your teeth and be gentle when you floss.  A sexual relationship may be continued unless your health care provider directs you otherwise.  Continue to go to all your prenatal visits as directed by your health care provider. SEEK MEDICAL CARE IF:   You have dizziness.  You have mild pelvic cramps, pelvic pressure, or nagging pain in the abdominal area.  You have persistent nausea, vomiting, or diarrhea.  You have a bad smelling vaginal discharge.  You have pain with urination. SEEK IMMEDIATE MEDICAL CARE IF:   You have a fever.  You are leaking fluid from your vagina.  You have spotting or bleeding from your vagina.  You have severe abdominal cramping or pain.  You have rapid weight gain or loss.  You have shortness of breath with chest pain.  You notice sudden or extreme swelling of your face, hands, ankles, feet, or legs.  You have not felt your baby move in over an hour.  You have severe headaches that do not go away with  medicine.  You have vision changes. Document Released: 09/11/2001 Document Revised: 09/22/2013 Document Reviewed: 11/18/2012 ExitCare Patient Information 2015 [employer]. This information is not intended to replace advice given to you by your health care provider. Make sure you discuss any questions you have with your health care provider.  

## 2014-05-17 NOTE — Progress Notes (Signed)
Reviewed anatomy US normal but incomplete. Will schedule follow up US.

## 2014-06-09 ENCOUNTER — Ambulatory Visit (HOSPITAL_COMMUNITY)
Admission: RE | Admit: 2014-06-09 | Discharge: 2014-06-09 | Disposition: A | Discharge status: Home / Self Care | Payer: BC Managed Care – PPO | Source: Ambulatory Visit | Attending: Advanced Practice Midwife | Admitting: Advanced Practice Midwife

## 2014-06-09 DIAGNOSIS — Z3689 Encounter for other specified antenatal screening: Secondary | ICD-10-CM | POA: Insufficient documentation

## 2014-06-09 DIAGNOSIS — Z3492 Encounter for supervision of normal pregnancy, unspecified, second trimester: Secondary | ICD-10-CM

## 2014-06-10 ENCOUNTER — Encounter: Payer: Self-pay | Admitting: Advanced Practice Midwife

## 2014-06-14 ENCOUNTER — Encounter: Payer: Self-pay | Admitting: Family

## 2014-06-14 ENCOUNTER — Ambulatory Visit (INDEPENDENT_AMBULATORY_CARE_PROVIDER_SITE_OTHER): Payer: BC Managed Care – PPO | Admitting: Family

## 2014-06-14 VITALS — BP 100/58 | HR 102 | Wt 114.8 lb

## 2014-06-14 DIAGNOSIS — Z3491 Encounter for supervision of normal pregnancy, unspecified, first trimester: Secondary | ICD-10-CM

## 2014-06-14 DIAGNOSIS — Z3400 Encounter for supervision of normal first pregnancy, unspecified trimester: Secondary | ICD-10-CM

## 2014-06-14 DIAGNOSIS — Z3402 Encounter for supervision of normal first pregnancy, second trimester: Secondary | ICD-10-CM

## 2014-06-14 DIAGNOSIS — Z113 Encounter for screening for infections with a predominantly sexual mode of transmission: Secondary | ICD-10-CM

## 2014-06-14 NOTE — Progress Notes (Signed)
Reports pain in hips with walking.  No bleeding or leaking of fluid.  Explained normal.  GC and chlamydia screening today.  Will add HIV based on CDC recommendations.

## 2014-06-14 NOTE — Progress Notes (Signed)
Has been having increased hip pain and soreness.  Would like testing for STI today as well.

## 2014-06-15 LAB — GC/CHLAMYDIA PROBE AMP
CT PROBE, AMP APTIMA: NEGATIVE
GC Probe RNA: NEGATIVE

## 2014-06-15 LAB — HIV ANTIBODY (ROUTINE TESTING W REFLEX): HIV 1&2 Ab, 4th Generation: NONREACTIVE

## 2014-06-28 ENCOUNTER — Ambulatory Visit (INDEPENDENT_AMBULATORY_CARE_PROVIDER_SITE_OTHER): Payer: BC Managed Care – PPO | Admitting: Advanced Practice Midwife

## 2014-06-28 VITALS — BP 102/58 | HR 92 | Wt 114.0 lb

## 2014-06-28 DIAGNOSIS — Z3491 Encounter for supervision of normal pregnancy, unspecified, first trimester: Secondary | ICD-10-CM

## 2014-06-28 DIAGNOSIS — Z23 Encounter for immunization: Secondary | ICD-10-CM

## 2014-06-28 DIAGNOSIS — M542 Cervicalgia: Secondary | ICD-10-CM

## 2014-06-28 DIAGNOSIS — N898 Other specified noninflammatory disorders of vagina: Secondary | ICD-10-CM

## 2014-06-28 DIAGNOSIS — O4700 False labor before 37 completed weeks of gestation, unspecified trimester: Secondary | ICD-10-CM

## 2014-06-28 DIAGNOSIS — Z3400 Encounter for supervision of normal first pregnancy, unspecified trimester: Secondary | ICD-10-CM

## 2014-06-28 DIAGNOSIS — Z3402 Encounter for supervision of normal first pregnancy, second trimester: Secondary | ICD-10-CM

## 2014-06-28 DIAGNOSIS — Z113 Encounter for screening for infections with a predominantly sexual mode of transmission: Secondary | ICD-10-CM | POA: Diagnosis not present

## 2014-06-28 DIAGNOSIS — O26892 Other specified pregnancy related conditions, second trimester: Secondary | ICD-10-CM

## 2014-06-28 DIAGNOSIS — O98811 Other maternal infectious and parasitic diseases complicating pregnancy, first trimester: Secondary | ICD-10-CM

## 2014-06-28 DIAGNOSIS — A749 Chlamydial infection, unspecified: Secondary | ICD-10-CM

## 2014-06-28 NOTE — Progress Notes (Signed)
Increase cramping sensation

## 2014-06-28 NOTE — Progress Notes (Signed)
Clamydia TOC neg. 28 week labs today. Increased cramping. Unsure if contractions or RLP. FFN.. PTL precautions. Flu and TDaP today.

## 2014-06-28 NOTE — Patient Instructions (Signed)

## 2014-06-29 ENCOUNTER — Other Ambulatory Visit: Payer: Self-pay | Admitting: Advanced Practice Midwife

## 2014-06-29 ENCOUNTER — Encounter: Payer: Self-pay | Admitting: Advanced Practice Midwife

## 2014-06-29 ENCOUNTER — Telehealth: Payer: Self-pay | Admitting: *Deleted

## 2014-06-29 DIAGNOSIS — B9689 Other specified bacterial agents as the cause of diseases classified elsewhere: Secondary | ICD-10-CM

## 2014-06-29 DIAGNOSIS — O4702 False labor before 37 completed weeks of gestation, second trimester: Secondary | ICD-10-CM | POA: Insufficient documentation

## 2014-06-29 DIAGNOSIS — B373 Candidiasis of vulva and vagina: Secondary | ICD-10-CM

## 2014-06-29 DIAGNOSIS — B3731 Acute candidiasis of vulva and vagina: Secondary | ICD-10-CM

## 2014-06-29 DIAGNOSIS — N760 Acute vaginitis: Principal | ICD-10-CM

## 2014-06-29 LAB — CBC
HEMATOCRIT: 28.9 % — AB (ref 36.0–46.0)
Hemoglobin: 8.9 g/dL — ABNORMAL LOW (ref 12.0–15.0)
MCH: 23 pg — AB (ref 26.0–34.0)
MCHC: 30.8 g/dL (ref 30.0–36.0)
MCV: 74.7 fL — AB (ref 78.0–100.0)
Platelets: 234 10*3/uL (ref 150–400)
RBC: 3.87 MIL/uL (ref 3.87–5.11)
RDW: 16.3 % — AB (ref 11.5–15.5)
WBC: 7.4 10*3/uL (ref 4.0–10.5)

## 2014-06-29 LAB — RPR

## 2014-06-29 LAB — WET PREP FOR TRICH, YEAST, CLUE: Trich, Wet Prep: NONE SEEN

## 2014-06-29 LAB — FETAL FIBRONECTIN: Fetal Fibronectin: NEGATIVE

## 2014-06-29 LAB — GLUCOSE TOLERANCE, 1 HOUR (50G) W/O FASTING: Glucose, 1 Hour GTT: 157 mg/dL — ABNORMAL HIGH (ref 70–140)

## 2014-06-29 MED ORDER — METRONIDAZOLE 500 MG PO TABS: 500.0000 mg | ORAL_TABLET | Freq: Two times a day (BID) | ORAL | Status: DC

## 2014-06-29 MED ORDER — TERCONAZOLE 0.4 % VA CREA: 1.0000 | TOPICAL_CREAM | Freq: Every day | VAGINAL | Status: DC

## 2014-06-29 NOTE — Progress Notes (Signed)
BV and yeast per Wet prep.

## 2014-06-29 NOTE — Telephone Encounter (Signed)
LM on voicemail of postivie BV and yeast and RX to trea the 2 were sent to [facility].  Pt also needs to call and schedule a 3 hr GTT.

## 2014-06-29 NOTE — Telephone Encounter (Signed)
Message copied by Granville LewisLARK, Totiana Everson L on Tue Jun 29, 2014 11:02 AM ------      Message from: Dorathy KinsmanSMITH, [place]      Created: Tue Jun 29, 2014  8:04 AM       Needs 3 hour GTT      FeSO4 325 mg PO BID (OTC)      Terazol 7      Flagyl 500 mg PO BID x 7 days #14      (I'll try to put these in an orders only encounter.) ------

## 2014-07-01 ENCOUNTER — Other Ambulatory Visit: Payer: BC Managed Care – PPO

## 2014-07-01 DIAGNOSIS — R7309 Other abnormal glucose: Secondary | ICD-10-CM

## 2014-07-02 ENCOUNTER — Telehealth: Payer: Self-pay | Admitting: *Deleted

## 2014-07-02 DIAGNOSIS — O24419 Gestational diabetes mellitus in pregnancy, unspecified control: Secondary | ICD-10-CM

## 2014-07-02 DIAGNOSIS — B9689 Other specified bacterial agents as the cause of diseases classified elsewhere: Secondary | ICD-10-CM

## 2014-07-02 DIAGNOSIS — N760 Acute vaginitis: Secondary | ICD-10-CM

## 2014-07-02 LAB — GLUCOSE TOLERANCE, 3 HOURS
Glucose Tolerance, 1 hour: 207 mg/dL — ABNORMAL HIGH (ref 70–189)
Glucose Tolerance, 2 hour: 200 mg/dL — ABNORMAL HIGH (ref 70–164)
Glucose Tolerance, Fasting: 88 mg/dL (ref 70–104)
Glucose, GTT - 3 Hour: 171 mg/dL — ABNORMAL HIGH (ref 70–144)

## 2014-07-02 MED ORDER — METRONIDAZOLE 0.75 % EX GEL: CUTANEOUS | Status: DC

## 2014-07-02 NOTE — Telephone Encounter (Signed)
Pt called stating that she could not take the oral Falgyl as it makes her stomach aches.  She is requesting something vaginally.  Spoke with Collene GobbleLisa Leftwich, CNM who stated that Flagyl gel is fine.  That RX was sent to pt's pharmacy

## 2014-07-05 ENCOUNTER — Encounter: Payer: Self-pay | Admitting: Advanced Practice Midwife

## 2014-07-05 DIAGNOSIS — Z8632 Personal history of gestational diabetes: Secondary | ICD-10-CM

## 2014-07-05 HISTORY — DX: Personal history of gestational diabetes: Z86.32

## 2014-07-07 ENCOUNTER — Encounter: Payer: BC Managed Care – PPO | Attending: Advanced Practice Midwife

## 2014-07-07 VITALS — Ht 60.0 in | Wt 111.5 lb

## 2014-07-07 DIAGNOSIS — O24419 Gestational diabetes mellitus in pregnancy, unspecified control: Secondary | ICD-10-CM | POA: Diagnosis not present

## 2014-07-07 DIAGNOSIS — Z713 Dietary counseling and surveillance: Secondary | ICD-10-CM | POA: Insufficient documentation

## 2014-07-12 ENCOUNTER — Ambulatory Visit (INDEPENDENT_AMBULATORY_CARE_PROVIDER_SITE_OTHER): Payer: BC Managed Care – PPO | Admitting: Advanced Practice Midwife

## 2014-07-12 VITALS — BP 95/57 | HR 85 | Wt 114.0 lb

## 2014-07-12 DIAGNOSIS — O24410 Gestational diabetes mellitus in pregnancy, diet controlled: Secondary | ICD-10-CM

## 2014-07-12 NOTE — Progress Notes (Signed)
Doing well.  Good fetal movement, denies vaginal bleeding, LOF, regular contractions.  Reviewed glucose log, Fastings 78-84. PP 70 - 114 with one outlier of 133.  Discussed diabetic diet, options for snacks so pt not hungry.  Pt husband unable to come to visits in [place] b/c of work.  With GDM, he wants to attend more visits, as he is worried.  [facility]. Pt will discuss with husband and decide.  Growth u/s ordered r/t diabetes.

## 2014-07-14 ENCOUNTER — Telehealth: Payer: Self-pay | Admitting: *Deleted

## 2014-07-14 DIAGNOSIS — O24419 Gestational diabetes mellitus in pregnancy, unspecified control: Secondary | ICD-10-CM

## 2014-07-14 MED ORDER — GLUCOSE BLOOD VI STRP: ORAL_STRIP | Status: DC

## 2014-07-14 MED ORDER — ONETOUCH ULTRASOFT LANCETS MISC: Status: DC

## 2014-07-14 NOTE — Progress Notes (Signed)
  Patient was seen on 07/07/14 for Gestational Diabetes self-management . The following learning objectives were met by the patient :   States the definition of Gestational Diabetes  States why dietary management is important in controlling blood glucose  Describes the effects of carbohydrates on blood glucose levels  Demonstrates ability to create a balanced meal plan  Demonstrates carbohydrate counting   States when to check blood glucose levels  Demonstrates proper blood glucose monitoring techniques  States the effect of stress and exercise on blood glucose levels  States the importance of limiting caffeine and abstaining from alcohol and smoking  Plan:  Aim for 2 Carb Choices per meal (30 grams) +/- 1 either way for breakfast Aim for 3 Carb Choices per meal (45 grams) +/- 1 either way from lunch and dinner Aim for 1-2 Carbs per snack Begin reading food labels for Total Carbohydrate and sugar grams of foods Consider  increasing your activity level by walking daily as tolerated Begin checking BG before breakfast and 2 hours after first bit of breakfast, lunch and dinner after  as directed by MD  Take medication  as directed by MD  Blood glucose monitor given: One Touch ultra 2 Lot # [id number] X Exp: 01/2015 Blood glucose reading: $RemoveBeforeDE'93mg'[id number]$ /dl  Patient instructed to monitor glucose levels: FBS: 60 - <90 2 hour: <120  Patient received the following handouts:  Nutrition Diabetes and Pregnancy  Carbohydrate Counting List  Meal Planning worksheet  Patient will be seen for follow-up as needed.

## 2014-07-14 NOTE — Telephone Encounter (Signed)
RX for lancets and glucose sent to pt's pharmacy

## 2014-07-19 ENCOUNTER — Ambulatory Visit (HOSPITAL_COMMUNITY)
Admission: RE | Admit: 2014-07-19 | Discharge: 2014-07-19 | Disposition: A | Discharge status: Home / Self Care | Payer: BC Managed Care – PPO | Source: Ambulatory Visit | Attending: Advanced Practice Midwife | Admitting: Advanced Practice Midwife

## 2014-07-19 DIAGNOSIS — O24410 Gestational diabetes mellitus in pregnancy, diet controlled: Secondary | ICD-10-CM | POA: Diagnosis present

## 2014-07-19 DIAGNOSIS — Z3A30 30 weeks gestation of pregnancy: Secondary | ICD-10-CM | POA: Diagnosis not present

## 2014-07-20 ENCOUNTER — Ambulatory Visit (HOSPITAL_COMMUNITY): Payer: No Typology Code available for payment source

## 2014-07-21 ENCOUNTER — Other Ambulatory Visit: Payer: Self-pay | Admitting: *Deleted

## 2014-07-21 DIAGNOSIS — O24419 Gestational diabetes mellitus in pregnancy, unspecified control: Secondary | ICD-10-CM

## 2014-07-21 MED ORDER — GLUCOSE BLOOD VI STRP: ORAL_STRIP | Status: DC

## 2014-07-21 NOTE — Telephone Encounter (Signed)
[facility] Glucom[facility].  This was sent via e-scribe last week but pharmacy states they never received it.  Per computer it was received.

## 2014-07-26 ENCOUNTER — Ambulatory Visit (INDEPENDENT_AMBULATORY_CARE_PROVIDER_SITE_OTHER): Payer: BC Managed Care – PPO | Admitting: Obstetrics & Gynecology

## 2014-07-26 ENCOUNTER — Encounter: Payer: Self-pay | Admitting: Obstetrics & Gynecology

## 2014-07-26 VITALS — BP 101/65 | HR 92 | Wt 117.0 lb

## 2014-07-26 DIAGNOSIS — Z3483 Encounter for supervision of other normal pregnancy, third trimester: Secondary | ICD-10-CM

## 2014-07-26 DIAGNOSIS — Z3A31 31 weeks gestation of pregnancy: Secondary | ICD-10-CM

## 2014-07-26 DIAGNOSIS — Z3491 Encounter for supervision of normal pregnancy, unspecified, first trimester: Secondary | ICD-10-CM

## 2014-07-26 DIAGNOSIS — O24410 Gestational diabetes mellitus in pregnancy, diet controlled: Secondary | ICD-10-CM

## 2014-07-26 NOTE — Progress Notes (Signed)
Routine visit. Good FM. She has a hemorrhoid and I have recommended OTC meds. PTL precautions reviewed. She has only taken her sugar a few times as she did not have enough lancets. Those sugars were fine.

## 2014-07-26 NOTE — Progress Notes (Signed)
Pt c/o bleeding hemorrhoid.  Would like a RX for cream.

## 2014-08-02 ENCOUNTER — Encounter: Payer: Self-pay | Admitting: Obstetrics & Gynecology

## 2014-08-09 ENCOUNTER — Ambulatory Visit (INDEPENDENT_AMBULATORY_CARE_PROVIDER_SITE_OTHER): Payer: BC Managed Care – PPO | Admitting: Advanced Practice Midwife

## 2014-08-09 VITALS — BP 93/58 | HR 89 | Wt 117.0 lb

## 2014-08-09 DIAGNOSIS — Z3403 Encounter for supervision of normal first pregnancy, third trimester: Secondary | ICD-10-CM

## 2014-08-09 DIAGNOSIS — O24419 Gestational diabetes mellitus in pregnancy, unspecified control: Secondary | ICD-10-CM

## 2014-08-09 NOTE — Progress Notes (Signed)
Fasting blood sugars 70-94. Two values were 94.  2 Hr PCs were 97-132.  There were 2-3 values over 120, states when she works, sometimes only has fast food to eat. Generally diet is good. Suggested taking her dinner when she works. Had an US on 10/19 which showed 46%ile growth and posterior placenta.

## 2014-08-09 NOTE — Patient Instructions (Signed)
Third Trimester of Pregnancy The third trimester is from week 29 through week 42, months 7 through 9. The third trimester is a time when the fetus is growing rapidly. At the end of the ninth month, the fetus is about 20 inches in length and weighs 6-10 pounds.  BODY CHANGES Your body goes through many changes during pregnancy. The changes vary from woman to woman.   Your weight will continue to increase. You can expect to gain 25-35 pounds (11-16 kg) by the end of the pregnancy.  You may begin to get stretch marks on your hips, abdomen, and breasts.  You may urinate more often because the fetus is moving lower into your pelvis and pressing on your bladder.  You may develop or continue to have heartburn as a result of your pregnancy.  You may develop constipation because certain hormones are causing the muscles that push waste through your intestines to slow down.  You may develop hemorrhoids or swollen, bulging veins (varicose veins).  You may have pelvic pain because of the weight gain and pregnancy hormones relaxing your joints between the bones in your pelvis. Backaches may result from overexertion of the muscles supporting your posture.  You may have changes in your hair. These can include thickening of your hair, rapid growth, and changes in texture. Some women also have hair loss during or after pregnancy, or hair that feels dry or thin. Your hair will most likely return to normal after your baby is born.  Your breasts will continue to grow and be tender. A yellow discharge may leak from your breasts called colostrum.  Your belly button may stick out.  You may feel short of breath because of your expanding uterus.  You may notice the fetus "dropping," or moving lower in your abdomen.  You may have a bloody mucus discharge. This usually occurs a few days to a week before labor begins.  Your cervix becomes thin and soft (effaced) near your due date. WHAT TO EXPECT AT YOUR PRENATAL  EXAMS  You will have prenatal exams every 2 weeks until week 36. Then, you will have weekly prenatal exams. During a routine prenatal visit:  You will be weighed to make sure you and the fetus are growing normally.  Your blood pressure is taken.  Your abdomen will be measured to track your baby's growth.  The fetal heartbeat will be listened to.  Any test results from the previous visit will be discussed.  You may have a cervical check near your due date to see if you have effaced. At around 36 weeks, your caregiver will check your cervix. At the same time, your caregiver will also perform a test on the secretions of the vaginal tissue. This test is to determine if a type of bacteria, Group B streptococcus, is present. Your caregiver will explain this further. Your caregiver may ask you:  What your birth plan is.  How you are feeling.  If you are feeling the baby move.  If you have had any abnormal symptoms, such as leaking fluid, bleeding, severe headaches, or abdominal cramping.  If you have any questions. Other tests or screenings that may be performed during your third trimester include:  Blood tests that check for low iron levels (anemia).  Fetal testing to check the health, activity level, and growth of the fetus. Testing is done if you have certain medical conditions or if there are problems during the pregnancy. FALSE LABOR You may feel small, irregular contractions that   eventually go away. These are called Braxton Hicks contractions, or false labor. Contractions may last for hours, days, or even weeks before true labor sets in. If contractions come at regular intervals, intensify, or become painful, it is best to be seen by your caregiver.  SIGNS OF LABOR   Menstrual-like cramps.  Contractions that are 5 minutes apart or less.  Contractions that start on the top of the uterus and spread down to the lower abdomen and back.  A sense of increased pelvic pressure or back  pain.  A watery or bloody mucus discharge that comes from the vagina. If you have any of these signs before the 37th week of pregnancy, call your caregiver right away. You need to go to the hospital to get checked immediately. HOME CARE INSTRUCTIONS   Avoid all smoking, herbs, alcohol, and unprescribed drugs. These chemicals affect the formation and growth of the baby.  Follow your caregiver's instructions regarding medicine use. There are medicines that are either safe or unsafe to take during pregnancy.  Exercise only as directed by your caregiver. Experiencing uterine cramps is a good sign to stop exercising.  Continue to eat regular, healthy meals.  Wear a good support bra for breast tenderness.  Do not use hot tubs, steam rooms, or saunas.  Wear your seat belt at all times when driving.  Avoid raw meat, uncooked cheese, cat litter boxes, and soil used by cats. These carry germs that can cause birth defects in the baby.  Take your prenatal vitamins.  Try taking a stool softener (if your caregiver approves) if you develop constipation. Eat more high-fiber foods, such as fresh vegetables or fruit and whole grains. Drink plenty of fluids to keep your urine clear or pale yellow.  Take warm sitz baths to soothe any pain or discomfort caused by hemorrhoids. Use hemorrhoid cream if your caregiver approves.  If you develop varicose veins, wear support hose. Elevate your feet for 15 minutes, 3-4 times a day. Limit salt in your diet.  Avoid heavy lifting, wear low heal shoes, and practice good posture.  Rest a lot with your legs elevated if you have leg cramps or low back pain.  Visit your dentist if you have not gone during your pregnancy. Use a soft toothbrush to brush your teeth and be gentle when you floss.  A sexual relationship may be continued unless your caregiver directs you otherwise.  Do not travel far distances unless it is absolutely necessary and only with the approval  of your caregiver.  Take prenatal classes to understand, practice, and ask questions about the labor and delivery.  Make a trial run to the hospital.  Pack your hospital bag.  Prepare the baby's nursery.  Continue to go to all your prenatal visits as directed by your caregiver. SEEK MEDICAL CARE IF:  You are unsure if you are in labor or if your water has broken.  You have dizziness.  You have mild pelvic cramps, pelvic pressure, or nagging pain in your abdominal area.  You have persistent nausea, vomiting, or diarrhea.  You have a bad smelling vaginal discharge.  You have pain with urination. SEEK IMMEDIATE MEDICAL CARE IF:   You have a fever.  You are leaking fluid from your vagina.  You have spotting or bleeding from your vagina.  You have severe abdominal cramping or pain.  You have rapid weight loss or gain.  You have shortness of breath with chest pain.  You notice sudden or extreme swelling   of your face, hands, ankles, feet, or legs.  You have not felt your baby move in over an hour.  You have severe headaches that do not go away with medicine.  You have vision changes. Document Released: 09/11/2001 Document Revised: 09/22/2013 Document Reviewed: 11/18/2012 Exit[employer] Patient Information 2015 [employer]. This information is not intended to replace advice given to you by your health care provider. Make sure you discuss any questions you have with your health care provider.  

## 2014-08-17 ENCOUNTER — Encounter: Payer: Self-pay | Admitting: Obstetrics & Gynecology

## 2014-08-17 ENCOUNTER — Ambulatory Visit (INDEPENDENT_AMBULATORY_CARE_PROVIDER_SITE_OTHER): Payer: BC Managed Care – PPO | Admitting: Obstetrics & Gynecology

## 2014-08-17 VITALS — BP 104/67 | HR 95 | Temp 97.8°F | Wt 118.0 lb

## 2014-08-17 DIAGNOSIS — O98813 Other maternal infectious and parasitic diseases complicating pregnancy, third trimester: Secondary | ICD-10-CM | POA: Diagnosis not present

## 2014-08-17 DIAGNOSIS — J029 Acute pharyngitis, unspecified: Secondary | ICD-10-CM | POA: Diagnosis not present

## 2014-08-17 DIAGNOSIS — Z3491 Encounter for supervision of normal pregnancy, unspecified, first trimester: Secondary | ICD-10-CM

## 2014-08-17 MED ORDER — CEPHALEXIN 500 MG PO CAPS: 500.0000 mg | ORAL_CAPSULE | Freq: Four times a day (QID) | ORAL | Status: DC

## 2014-08-17 NOTE — Progress Notes (Signed)
Work in visit for sore throat, pain in ears, and yellow phlegm. Good FM and no signs/symptoms of PTL. TMs clear bilaterally. Throat culture sent. Keflex QID prescribed. Keep routine OB visit in 1 week/prn sooner

## 2014-08-17 NOTE — Progress Notes (Signed)
Coughing up yellow phlegm and bilateral ear pain and laryngitis

## 2014-08-17 NOTE — Addendum Note (Signed)
Addended by: Arne ClevelandHUTCHINSON, Chaley Castellanos J on: 08/17/2014 03:40 PM   Modules accepted: Orders

## 2014-08-18 ENCOUNTER — Encounter: Payer: BC Managed Care – PPO | Admitting: Obstetrics & Gynecology

## 2014-08-19 LAB — CULTURE, GROUP A STREP: Organism ID, Bacteria: NORMAL

## 2014-08-23 ENCOUNTER — Encounter: Payer: Self-pay | Admitting: Advanced Practice Midwife

## 2014-08-23 ENCOUNTER — Ambulatory Visit (INDEPENDENT_AMBULATORY_CARE_PROVIDER_SITE_OTHER): Payer: BC Managed Care – PPO | Admitting: Advanced Practice Midwife

## 2014-08-23 VITALS — BP 106/69 | HR 74 | Wt 120.0 lb

## 2014-08-23 DIAGNOSIS — O24419 Gestational diabetes mellitus in pregnancy, unspecified control: Secondary | ICD-10-CM

## 2014-08-23 DIAGNOSIS — Z3403 Encounter for supervision of normal first pregnancy, third trimester: Secondary | ICD-10-CM

## 2014-08-23 NOTE — Patient Instructions (Signed)
Third Trimester of Pregnancy The third trimester is from week 29 through week 42, months 7 through 9. The third trimester is a time when the fetus is growing rapidly. At the end of the ninth month, the fetus is about 20 inches in length and weighs 6-10 pounds.  BODY CHANGES Your body goes through many changes during pregnancy. The changes vary from woman to woman.   Your weight will continue to increase. You can expect to gain 25-35 pounds (11-16 kg) by the end of the pregnancy.  You may begin to get stretch marks on your hips, abdomen, and breasts.  You may urinate more often because the fetus is moving lower into your pelvis and pressing on your bladder.  You may develop or continue to have heartburn as a result of your pregnancy.  You may develop constipation because certain hormones are causing the muscles that push waste through your intestines to slow down.  You may develop hemorrhoids or swollen, bulging veins (varicose veins).  You may have pelvic pain because of the weight gain and pregnancy hormones relaxing your joints between the bones in your pelvis. Backaches may result from overexertion of the muscles supporting your posture.  You may have changes in your hair. These can include thickening of your hair, rapid growth, and changes in texture. Some women also have hair loss during or after pregnancy, or hair that feels dry or thin. Your hair will most likely return to normal after your baby is born.  Your breasts will continue to grow and be tender. A yellow discharge may leak from your breasts called colostrum.  Your belly button may stick out.  You may feel short of breath because of your expanding uterus.  You may notice the fetus "dropping," or moving lower in your abdomen.  You may have a bloody mucus discharge. This usually occurs a few days to a week before labor begins.  Your cervix becomes thin and soft (effaced) near your due date. WHAT TO EXPECT AT YOUR PRENATAL  EXAMS  You will have prenatal exams every 2 weeks until week 36. Then, you will have weekly prenatal exams. During a routine prenatal visit:  You will be weighed to make sure you and the fetus are growing normally.  Your blood pressure is taken.  Your abdomen will be measured to track your baby's growth.  The fetal heartbeat will be listened to.  Any test results from the previous visit will be discussed.  You may have a cervical check near your due date to see if you have effaced. At around 36 weeks, your caregiver will check your cervix. At the same time, your caregiver will also perform a test on the secretions of the vaginal tissue. This test is to determine if a type of bacteria, Group B streptococcus, is present. Your caregiver will explain this further. Your caregiver may ask you:  What your birth plan is.  How you are feeling.  If you are feeling the baby move.  If you have had any abnormal symptoms, such as leaking fluid, bleeding, severe headaches, or abdominal cramping.  If you have any questions. Other tests or screenings that may be performed during your third trimester include:  Blood tests that check for low iron levels (anemia).  Fetal testing to check the health, activity level, and growth of the fetus. Testing is done if you have certain medical conditions or if there are problems during the pregnancy. FALSE LABOR You may feel small, irregular contractions that   eventually go away. These are called Braxton Hicks contractions, or false labor. Contractions may last for hours, days, or even weeks before true labor sets in. If contractions come at regular intervals, intensify, or become painful, it is best to be seen by your caregiver.  SIGNS OF LABOR   Menstrual-like cramps.  Contractions that are 5 minutes apart or less.  Contractions that start on the top of the uterus and spread down to the lower abdomen and back.  A sense of increased pelvic pressure or back  pain.  A watery or bloody mucus discharge that comes from the vagina. If you have any of these signs before the 37th week of pregnancy, call your caregiver right away. You need to go to the hospital to get checked immediately. HOME CARE INSTRUCTIONS   Avoid all smoking, herbs, alcohol, and unprescribed drugs. These chemicals affect the formation and growth of the baby.  Follow your caregiver's instructions regarding medicine use. There are medicines that are either safe or unsafe to take during pregnancy.  Exercise only as directed by your caregiver. Experiencing uterine cramps is a good sign to stop exercising.  Continue to eat regular, healthy meals.  Wear a good support bra for breast tenderness.  Do not use hot tubs, steam rooms, or saunas.  Wear your seat belt at all times when driving.  Avoid raw meat, uncooked cheese, cat litter boxes, and soil used by cats. These carry germs that can cause birth defects in the baby.  Take your prenatal vitamins.  Try taking a stool softener (if your caregiver approves) if you develop constipation. Eat more high-fiber foods, such as fresh vegetables or fruit and whole grains. Drink plenty of fluids to keep your urine clear or pale yellow.  Take warm sitz baths to soothe any pain or discomfort caused by hemorrhoids. Use hemorrhoid cream if your caregiver approves.  If you develop varicose veins, wear support hose. Elevate your feet for 15 minutes, 3-4 times a day. Limit salt in your diet.  Avoid heavy lifting, wear low heal shoes, and practice good posture.  Rest a lot with your legs elevated if you have leg cramps or low back pain.  Visit your dentist if you have not gone during your pregnancy. Use a soft toothbrush to brush your teeth and be gentle when you floss.  A sexual relationship may be continued unless your caregiver directs you otherwise.  Do not travel far distances unless it is absolutely necessary and only with the approval  of your caregiver.  Take prenatal classes to understand, practice, and ask questions about the labor and delivery.  Make a trial run to the hospital.  Pack your hospital bag.  Prepare the baby's nursery.  Continue to go to all your prenatal visits as directed by your caregiver. SEEK MEDICAL CARE IF:  You are unsure if you are in labor or if your water has broken.  You have dizziness.  You have mild pelvic cramps, pelvic pressure, or nagging pain in your abdominal area.  You have persistent nausea, vomiting, or diarrhea.  You have a bad smelling vaginal discharge.  You have pain with urination. SEEK IMMEDIATE MEDICAL CARE IF:   You have a fever.  You are leaking fluid from your vagina.  You have spotting or bleeding from your vagina.  You have severe abdominal cramping or pain.  You have rapid weight loss or gain.  You have shortness of breath with chest pain.  You notice sudden or extreme swelling   of your face, hands, ankles, feet, or legs.  You have not felt your baby move in over an hour.  You have severe headaches that do not go away with medicine.  You have vision changes. Document Released: 09/11/2001 Document Revised: 09/22/2013 Document Reviewed: 11/18/2012 Exit[employer] Patient Information 2015 [employer]. This information is not intended to replace advice given to you by your health care provider. Make sure you discuss any questions you have with your health care provider.  

## 2014-08-23 NOTE — Progress Notes (Signed)
Still coughing a lot, but better. No fever. Advised to add Mucinex. Plan cultures next week. 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 Advised it is up to her. If she feels well she can keep working.

## 2014-08-30 ENCOUNTER — Ambulatory Visit (INDEPENDENT_AMBULATORY_CARE_PROVIDER_SITE_OTHER): Payer: BC Managed Care – PPO | Admitting: Obstetrics & Gynecology

## 2014-08-30 ENCOUNTER — Other Ambulatory Visit: Payer: Self-pay | Admitting: Obstetrics & Gynecology

## 2014-08-30 VITALS — BP 109/66 | HR 64 | Wt 122.0 lb

## 2014-08-30 DIAGNOSIS — O24410 Gestational diabetes mellitus in pregnancy, diet controlled: Secondary | ICD-10-CM | POA: Diagnosis not present

## 2014-08-30 DIAGNOSIS — Z36 Encounter for antenatal screening of mother: Secondary | ICD-10-CM | POA: Diagnosis not present

## 2014-08-30 DIAGNOSIS — Z3403 Encounter for supervision of normal first pregnancy, third trimester: Secondary | ICD-10-CM

## 2014-08-30 LAB — OB RESULTS CONSOLE GC/CHLAMYDIA
Chlamydia: NEGATIVE
Gonorrhea: NEGATIVE

## 2014-08-30 LAB — OB RESULTS CONSOLE GBS: STREP GROUP B AG: NEGATIVE

## 2014-08-30 NOTE — Progress Notes (Signed)
fastings--all <90; no consistently checking due to work.  Past week, no pp  Breakfast, pp lunch 110,148,105, 95,139; pp dinner 123,  Pt eating out a lot due to work and holidays.  [place] for growth . Fundal  Fundal <dates. Cultures today Hgb A1C.  Pt encouraged to check blood sugars more often

## 2014-08-31 LAB — HEMOGLOBIN A1C
HEMOGLOBIN A1C: 6 % — AB (ref <5.7)
MEAN PLASMA GLUCOSE: 126 mg/dL — AB (ref <117)

## 2014-09-01 ENCOUNTER — Ambulatory Visit (HOSPITAL_COMMUNITY)
Admission: RE | Admit: 2014-09-01 | Discharge: 2014-09-01 | Disposition: A | Discharge status: Home / Self Care | Payer: BC Managed Care – PPO | Source: Ambulatory Visit | Attending: Obstetrics & Gynecology | Admitting: Obstetrics & Gynecology

## 2014-09-01 DIAGNOSIS — O24410 Gestational diabetes mellitus in pregnancy, diet controlled: Secondary | ICD-10-CM

## 2014-09-01 LAB — CULTURE, BETA STREP (GROUP B ONLY)

## 2014-09-01 LAB — GC/CHLAMYDIA PROBE AMP
CT Probe RNA: NEGATIVE
GC Probe RNA: NEGATIVE

## 2014-09-02 DIAGNOSIS — O24410 Gestational diabetes mellitus in pregnancy, diet controlled: Secondary | ICD-10-CM | POA: Insufficient documentation

## 2014-09-06 ENCOUNTER — Encounter: Payer: Self-pay | Admitting: Advanced Practice Midwife

## 2014-09-06 ENCOUNTER — Ambulatory Visit (INDEPENDENT_AMBULATORY_CARE_PROVIDER_SITE_OTHER): Payer: BC Managed Care – PPO | Admitting: Advanced Practice Midwife

## 2014-09-06 VITALS — BP 107/73 | HR 68 | Wt 124.1 lb

## 2014-09-06 DIAGNOSIS — Z3403 Encounter for supervision of normal first pregnancy, third trimester: Secondary | ICD-10-CM

## 2014-09-06 DIAGNOSIS — O24410 Gestational diabetes mellitus in pregnancy, diet controlled: Secondary | ICD-10-CM

## 2014-09-06 NOTE — Patient Instructions (Signed)
Gestational Diabetes Mellitus Gestational diabetes mellitus, often simply referred to as gestational diabetes, is a type of diabetes that some women develop during pregnancy. In gestational diabetes, the pancreas does not make enough insulin (a hormone), the cells are less responsive to the insulin that is made (insulin resistance), or both.Normally, insulin moves sugars from food into the tissue cells. The tissue cells use the sugars for energy. The lack of insulin or the lack of normal response to insulin causes excess sugars to build up in the blood instead of going into the tissue cells. As a result, high blood sugar (hyperglycemia) develops. The effect of high sugar (glucose) levels can cause many problems.  RISK FACTORS You have an increased chance of developing gestational diabetes if you have a family history of diabetes and also have one or more of the following risk factors:  A body mass index over 30 (obesity).  A previous pregnancy with gestational diabetes.  An older age at the time of pregnancy. If blood glucose levels are kept in the normal range during pregnancy, women can have a healthy pregnancy. If your blood glucose levels are not well controlled, there may be risks to you, your unborn baby (fetus), your labor and delivery, or your newborn baby.  SYMPTOMS  If symptoms are experienced, they are much like symptoms you would normally expect during pregnancy. The symptoms of gestational diabetes include:   Increased thirst (polydipsia).  Increased urination (polyuria).  Increased urination during the night (nocturia).  Weight loss. This weight loss may be rapid.  Frequent, recurring infections.  Tiredness (fatigue).  Weakness.  Vision changes, such as blurred vision.  Fruity smell to your breath.  Abdominal pain. DIAGNOSIS Diabetes is diagnosed when blood glucose levels are increased. Your blood glucose level may be checked by one or more of the following blood  tests:  A fasting blood glucose test. You will not be allowed to eat for at least 8 hours before a blood sample is taken.  A random blood glucose test. Your blood glucose is checked at any time of the day regardless of when you ate.  A hemoglobin A1c blood glucose test. A hemoglobin A1c test provides information about blood glucose control over the previous 3 months.  An oral glucose tolerance test (OGTT). Your blood glucose is measured after you have not eaten (fasted) for 1-3 hours and then after you drink a glucose-containing beverage. Since the hormones that cause insulin resistance are highest at about 24-28 weeks of a pregnancy, an OGTT is usually performed during that time. If you have risk factors for gestational diabetes, your health care provider may test you for gestational diabetes earlier than 24 weeks of pregnancy. TREATMENT   You will need to take diabetes medicine or insulin daily to keep blood glucose levels in the desired range.  You will need to match insulin dosing with exercise and healthy food choices. The treatment goal is to maintain the before-meal (preprandial), bedtime, and overnight blood glucose level at 60-99 mg/dL during pregnancy. The treatment goal is to further maintain peak after-meal blood sugar (postprandial glucose) level at 100-140 mg/dL. HOME CARE INSTRUCTIONS   Have your hemoglobin A1c level checked twice a year.  Perform daily blood glucose monitoring as directed by your health care provider. It is common to perform frequent blood glucose monitoring.  Monitor urine ketones when you are ill and as directed by your health care provider.  Take your diabetes medicine and insulin as directed by your health care provider   to maintain your blood glucose level in the desired range.  Never run out of diabetes medicine or insulin. It is needed every day.  Adjust insulin based on your intake of carbohydrates. Carbohydrates can raise blood glucose levels but  need to be included in your diet. Carbohydrates provide vitamins, minerals, and fiber which are an essential part of a healthy diet. Carbohydrates are found in fruits, vegetables, whole grains, dairy products, legumes, and foods containing added sugars.  Eat healthy foods. Alternate 3 meals with 3 snacks.  Maintain a healthy weight gain. The usual total expected weight gain varies according to your prepregnancy body mass index (BMI).  Carry a medical alert card or wear your medical alert jewelry.  Carry a 15-gram carbohydrate snack with you at all times to treat low blood glucose (hypoglycemia). Some examples of 15-gram carbohydrate snacks include:  Glucose tablets, 3 or 4.  Glucose gel, 15-gram tube.  Raisins, 2 tablespoons (24 g).  Jelly beans, 6.  Animal crackers, 8.  Fruit juice, regular soda, or low-fat milk, 4 ounces (120 mL).  Gummy treats, 9.  Recognize hypoglycemia. Hypoglycemia during pregnancy occurs with blood glucose levels of 60 mg/dL and below. The risk for hypoglycemia increases when fasting or skipping meals, during or after intense exercise, and during sleep. Hypoglycemia symptoms can include:  Tremors or shakes.  Decreased ability to concentrate.  Sweating.  Increased heart rate.  Headache.  Dry mouth.  Hunger.  Irritability.  Anxiety.  Restless sleep.  Altered speech or coordination.  Confusion.  Treat hypoglycemia promptly. If you are alert and able to safely swallow, follow the 15:15 rule:  Take 15-20 grams of rapid-acting glucose or carbohydrate. Rapid-acting options include glucose gel, glucose tablets, or 4 ounces (120 mL) of fruit juice, regular soda, or low-fat milk.  Check your blood glucose level 15 minutes after taking the glucose.  Take 15-20 grams more of glucose if the repeat blood glucose level is still 70 mg/dL or below.  Eat a meal or snack within 1 hour once blood glucose levels return to normal.  Be alert to polyuria  (excess urination) and polydipsia (excess thirst) which are early signs of hyperglycemia. An early awareness of hyperglycemia allows for prompt treatment. Treat hyperglycemia as directed by your health care provider.  Engage in at least 30 minutes of physical activity a day or as directed by your health care provider. Ten minutes of physical activity timed 30 minutes after each meal is encouraged to control postprandial blood glucose levels.  Adjust your insulin dosing and food intake as needed if you start a new exercise or sport.  Follow your sick-day plan at any time you are unable to eat or drink as usual.  Avoid tobacco and alcohol use.  Keep all follow-up visits as directed by your health care provider.  Follow the advice of your health care provider regarding your prenatal and post-delivery (postpartum) appointments, meal planning, exercise, medicines, vitamins, blood tests, other medical tests, and physical activities.  Perform daily skin and foot care. Examine your skin and feet daily for cuts, bruises, redness, nail problems, bleeding, blisters, or sores.  Brush your teeth and gums at least twice a day and floss at least once a day. Follow up with your dentist regularly.  Schedule an eye exam during the first trimester of your pregnancy or as directed by your health care provider.  Share your diabetes management plan with your workplace or school.  Stay up-to-date with immunizations.  Learn to manage stress.    Obtain ongoing diabetes education and support as needed.  Learn about and consider breastfeeding your baby.  You should have your blood sugar level checked 6-12 weeks after delivery. This is done with an oral glucose tolerance test (OGTT). SEEK MEDICAL CARE IF:   You are unable to eat food or drink fluids for more than 6 hours.  You have nausea and vomiting for more than 6 hours.  You have a blood glucose level of 200 mg/dL and you have ketones in your  urine.  There is a change in mental status.  You develop vision problems.  You have a persistent headache.  You have upper abdominal pain or discomfort.  You develop an additional serious illness.  You have diarrhea for more than 6 hours.  You have been sick or have had a fever for a couple of days and are not getting better. SEEK IMMEDIATE MEDICAL CARE IF:   You have difficulty breathing.  You no longer feel the baby moving.  You are bleeding or have discharge from your vagina.  You start having premature contractions or labor. MAKE SURE YOU:  Understand these instructions.  Will watch your condition.  Will get help right away if you are not doing well or get worse. Document Released: 12/24/2000 Document Revised: 02/01/2014 Document Reviewed: 04/15/2012 [employer] Patient Information 2015 [employer]. This information is not intended to replace advice given to you by your health care provider. Make sure you discuss any questions you have with your health care provider.  

## 2014-09-06 NOTE — Progress Notes (Signed)
Doing better bringing dinner to work. FBS [id number]      2 hr B= [id number]     2hr L= [id number]     2hr D= [id number] HgbA1C = 6.0  Cultures all neg, GBS  Neg.

## 2014-09-13 ENCOUNTER — Encounter: Payer: Self-pay | Admitting: Obstetrics & Gynecology

## 2014-09-13 ENCOUNTER — Ambulatory Visit (INDEPENDENT_AMBULATORY_CARE_PROVIDER_SITE_OTHER): Payer: BC Managed Care – PPO | Admitting: Obstetrics & Gynecology

## 2014-09-13 VITALS — BP 95/63 | HR 96 | Temp 97.8°F | Wt 122.0 lb

## 2014-09-13 DIAGNOSIS — Z3491 Encounter for supervision of normal pregnancy, unspecified, first trimester: Secondary | ICD-10-CM

## 2014-09-13 DIAGNOSIS — Z3481 Encounter for supervision of other normal pregnancy, first trimester: Secondary | ICD-10-CM

## 2014-09-13 NOTE — Progress Notes (Signed)
Routine visit. Good FM. Some virus symptoms of diarrhea today. I have recommended OTC meds. Rec stay hydrated. Labor precautions.

## 2014-09-13 NOTE — Progress Notes (Signed)
Vomiting and some loose stools since last night.  Would like cervix checked  Urine-Large Ketones  Bilirubin-1t

## 2014-09-20 ENCOUNTER — Ambulatory Visit (INDEPENDENT_AMBULATORY_CARE_PROVIDER_SITE_OTHER): Payer: BC Managed Care – PPO | Admitting: Obstetrics & Gynecology

## 2014-09-20 VITALS — BP 123/79 | HR 81 | Wt 127.8 lb

## 2014-09-20 DIAGNOSIS — Z3491 Encounter for supervision of normal pregnancy, unspecified, first trimester: Secondary | ICD-10-CM

## 2014-09-20 DIAGNOSIS — Z3481 Encounter for supervision of other normal pregnancy, first trimester: Secondary | ICD-10-CM

## 2014-09-20 NOTE — Progress Notes (Signed)
Routine visit. Good FM. No problems. Labor precautions reviewed. Excellent sugars. IOL at 40 weeks at 0800.

## 2014-09-22 ENCOUNTER — Inpatient Hospital Stay (HOSPITAL_COMMUNITY): Payer: BC Managed Care – PPO | Admitting: Anesthesiology

## 2014-09-22 ENCOUNTER — Encounter (HOSPITAL_COMMUNITY): Payer: Self-pay | Admitting: *Deleted

## 2014-09-22 ENCOUNTER — Inpatient Hospital Stay (HOSPITAL_COMMUNITY)
Admission: AD | Admit: 2014-09-22 | Discharge: 2014-09-24 | DRG: 775 | Disposition: A | Discharge status: Home / Self Care | Payer: BC Managed Care – PPO | Source: Ambulatory Visit | Attending: Obstetrics and Gynecology | Admitting: Obstetrics and Gynecology

## 2014-09-22 DIAGNOSIS — Z3A39 39 weeks gestation of pregnancy: Secondary | ICD-10-CM | POA: Diagnosis present

## 2014-09-22 DIAGNOSIS — Z3492 Encounter for supervision of normal pregnancy, unspecified, second trimester: Secondary | ICD-10-CM

## 2014-09-22 DIAGNOSIS — O24410 Gestational diabetes mellitus in pregnancy, diet controlled: Secondary | ICD-10-CM | POA: Diagnosis present

## 2014-09-22 DIAGNOSIS — IMO0001 Reserved for inherently not codable concepts without codable children: Secondary | ICD-10-CM

## 2014-09-22 LAB — HIV ANTIBODY (ROUTINE TESTING W REFLEX): HIV: NONREACTIVE

## 2014-09-22 LAB — CBC
HCT: 34.1 % — ABNORMAL LOW (ref 36.0–46.0)
HEMOGLOBIN: 11.2 g/dL — AB (ref 12.0–15.0)
MCH: 23.3 pg — ABNORMAL LOW (ref 26.0–34.0)
MCHC: 32.8 g/dL (ref 30.0–36.0)
MCV: 70.9 fL — ABNORMAL LOW (ref 78.0–100.0)
PLATELETS: 234 10*3/uL (ref 150–400)
RBC: 4.81 MIL/uL (ref 3.87–5.11)
RDW: 15.4 % (ref 11.5–15.5)
WBC: 15 10*3/uL — AB (ref 4.0–10.5)

## 2014-09-22 LAB — TYPE AND SCREEN
ABO/RH(D): A POS
ANTIBODY SCREEN: NEGATIVE

## 2014-09-22 LAB — GLUCOSE, CAPILLARY
GLUCOSE-CAPILLARY: 103 mg/dL — AB (ref 70–99)
GLUCOSE-CAPILLARY: 94 mg/dL (ref 70–99)
Glucose-Capillary: 102 mg/dL — ABNORMAL HIGH (ref 70–99)
Glucose-Capillary: 94 mg/dL (ref 70–99)

## 2014-09-22 LAB — RPR

## 2014-09-22 MED ORDER — OXYTOCIN BOLUS FROM INFUSION: 500.0000 mL | INTRAVENOUS | Status: DC

## 2014-09-22 MED ORDER — ACETAMINOPHEN 325 MG PO TABS: 650.0000 mg | ORAL_TABLET | ORAL | Status: DC | PRN

## 2014-09-22 MED ORDER — PHENYLEPHRINE 40 MCG/ML (10ML) SYRINGE FOR IV PUSH (FOR BLOOD PRESSURE SUPPORT): 80.0000 ug | PREFILLED_SYRINGE | INTRAVENOUS | Status: DC | PRN

## 2014-09-22 MED ORDER — PHENYLEPHRINE 40 MCG/ML (10ML) SYRINGE FOR IV PUSH (FOR BLOOD PRESSURE SUPPORT)
80.0000 ug | PREFILLED_SYRINGE | INTRAVENOUS | Status: DC | PRN
  Filled 2014-09-22: qty 10

## 2014-09-22 MED ORDER — ONDANSETRON HCL 4 MG/2ML IJ SOLN: 4.0000 mg | INTRAMUSCULAR | Status: DC | PRN

## 2014-09-22 MED ORDER — ONDANSETRON HCL 4 MG/2ML IJ SOLN: 4.0000 mg | Freq: Four times a day (QID) | INTRAMUSCULAR | Status: DC | PRN

## 2014-09-22 MED ORDER — OXYCODONE-ACETAMINOPHEN 5-325 MG PO TABS: 1.0000 | ORAL_TABLET | ORAL | Status: DC | PRN

## 2014-09-22 MED ORDER — FLEET ENEMA 7-19 GM/118ML RE ENEM: 1.0000 | ENEMA | RECTAL | Status: DC | PRN

## 2014-09-22 MED ORDER — CITRIC ACID-SODIUM CITRATE 334-500 MG/5ML PO SOLN: 30.0000 mL | ORAL | Status: DC | PRN

## 2014-09-22 MED ORDER — OXYCODONE-ACETAMINOPHEN 5-325 MG PO TABS: 2.0000 | ORAL_TABLET | ORAL | Status: DC | PRN

## 2014-09-22 MED ORDER — LACTATED RINGERS IV SOLN: 500.0000 mL | INTRAVENOUS | Status: DC | PRN

## 2014-09-22 MED ORDER — OXYTOCIN 40 UNITS IN LACTATED RINGERS INFUSION - SIMPLE MED
62.5000 mL/h | INTRAVENOUS | Status: DC
  Filled 2014-09-22: qty 1000

## 2014-09-22 MED ORDER — OXYTOCIN 40 UNITS IN LACTATED RINGERS INFUSION - SIMPLE MED
1.0000 m[IU]/min | INTRAVENOUS | Status: DC
  Administered 2014-09-22: 2 m[IU]/min via INTRAVENOUS

## 2014-09-22 MED ORDER — WITCH HAZEL-GLYCERIN EX PADS: 1.0000 "application " | MEDICATED_PAD | CUTANEOUS | Status: DC | PRN

## 2014-09-22 MED ORDER — OXYTOCIN 40 UNITS IN LACTATED RINGERS INFUSION - SIMPLE MED: 62.5000 mL/h | INTRAVENOUS | Status: DC

## 2014-09-22 MED ORDER — DIBUCAINE 1 % RE OINT
1.0000 "application " | TOPICAL_OINTMENT | RECTAL | Status: DC | PRN
  Filled 2014-09-22: qty 28

## 2014-09-22 MED ORDER — ZOLPIDEM TARTRATE 5 MG PO TABS: 5.0000 mg | ORAL_TABLET | Freq: Every evening | ORAL | Status: DC | PRN

## 2014-09-22 MED ORDER — FENTANYL 2.5 MCG/ML BUPIVACAINE 1/10 % EPIDURAL INFUSION (WH - ANES)
14.0000 mL/h | INTRAMUSCULAR | Status: DC | PRN
  Administered 2014-09-22: 14 mL/h via EPIDURAL
  Filled 2014-09-22: qty 125

## 2014-09-22 MED ORDER — LIDOCAINE HCL (PF) 1 % IJ SOLN: 30.0000 mL | INTRAMUSCULAR | Status: DC | PRN

## 2014-09-22 MED ORDER — LACTATED RINGERS IV SOLN: INTRAVENOUS | Status: DC

## 2014-09-22 MED ORDER — LIDOCAINE HCL (PF) 1 % IJ SOLN
30.0000 mL | INTRAMUSCULAR | Status: DC | PRN
  Filled 2014-09-22: qty 30

## 2014-09-22 MED ORDER — LANOLIN HYDROUS EX OINT: TOPICAL_OINTMENT | CUTANEOUS | Status: DC | PRN

## 2014-09-22 MED ORDER — LIDOCAINE HCL (PF) 1 % IJ SOLN
INTRAMUSCULAR | Status: DC | PRN
  Administered 2014-09-22: 5 mL
  Administered 2014-09-22: 3 mL

## 2014-09-22 MED ORDER — TETANUS-DIPHTH-ACELL PERTUSSIS 5-2.5-18.5 LF-MCG/0.5 IM SUSP
0.5000 mL | Freq: Once | INTRAMUSCULAR | Status: DC
  Filled 2014-09-22: qty 0.5

## 2014-09-22 MED ORDER — ACETAMINOPHEN 325 MG PO TABS
650.0000 mg | ORAL_TABLET | ORAL | Status: DC | PRN
  Administered 2014-09-22: 650 mg via ORAL
  Filled 2014-09-22 (×2): qty 2

## 2014-09-22 MED ORDER — EPHEDRINE 5 MG/ML INJ: 10.0000 mg | INTRAVENOUS | Status: DC | PRN

## 2014-09-22 MED ORDER — PRENATAL MULTIVITAMIN CH
1.0000 | ORAL_TABLET | Freq: Every day | ORAL | Status: DC
  Administered 2014-09-23: 1 via ORAL
  Filled 2014-09-22: qty 1

## 2014-09-22 MED ORDER — TERBUTALINE SULFATE 1 MG/ML IJ SOLN: 0.2500 mg | Freq: Once | INTRAMUSCULAR | Status: DC | PRN

## 2014-09-22 MED ORDER — DIPHENHYDRAMINE HCL 50 MG/ML IJ SOLN: 12.5000 mg | INTRAMUSCULAR | Status: DC | PRN

## 2014-09-22 MED ORDER — DIPHENHYDRAMINE HCL 25 MG PO CAPS: 25.0000 mg | ORAL_CAPSULE | Freq: Four times a day (QID) | ORAL | Status: DC | PRN

## 2014-09-22 MED ORDER — LACTATED RINGERS IV SOLN: 500.0000 mL | Freq: Once | INTRAVENOUS | Status: DC

## 2014-09-22 MED ORDER — BENZOCAINE-MENTHOL 20-0.5 % EX AERO
1.0000 "application " | INHALATION_SPRAY | CUTANEOUS | Status: DC | PRN
  Filled 2014-09-22 (×2): qty 56

## 2014-09-22 MED ORDER — FENTANYL 2.5 MCG/ML BUPIVACAINE 1/10 % EPIDURAL INFUSION (WH - ANES): 14.0000 mL/h | INTRAMUSCULAR | Status: DC | PRN

## 2014-09-22 MED ORDER — ONDANSETRON HCL 4 MG PO TABS: 4.0000 mg | ORAL_TABLET | ORAL | Status: DC | PRN

## 2014-09-22 MED ORDER — FENTANYL CITRATE 0.05 MG/ML IJ SOLN
100.0000 ug | INTRAMUSCULAR | Status: DC | PRN
  Administered 2014-09-22: 100 ug via INTRAVENOUS
  Filled 2014-09-22: qty 2

## 2014-09-22 MED ORDER — IBUPROFEN 600 MG PO TABS
600.0000 mg | ORAL_TABLET | Freq: Four times a day (QID) | ORAL | Status: DC
  Administered 2014-09-22 – 2014-09-24 (×6): 600 mg via ORAL
  Filled 2014-09-22 (×7): qty 1

## 2014-09-22 MED ORDER — SENNOSIDES-DOCUSATE SODIUM 8.6-50 MG PO TABS
2.0000 | ORAL_TABLET | ORAL | Status: DC
  Administered 2014-09-22 – 2014-09-23 (×2): 2 via ORAL
  Filled 2014-09-22 (×2): qty 2

## 2014-09-22 MED ORDER — SIMETHICONE 80 MG PO CHEW: 80.0000 mg | CHEWABLE_TABLET | ORAL | Status: DC | PRN

## 2014-09-22 NOTE — MAU Note (Signed)
Patient states she is having contractions every 2-6 minutes with bright red bloody show. Denies leaking fluid and reports good fetal movement.

## 2014-09-22 NOTE — Anesthesia Procedure Notes (Signed)
Epidural Patient location during procedure: OB  Preanesthetic Checklist Completed: patient identified, site marked, surgical consent, pre-op evaluation, timeout performed, IV checked, risks and benefits discussed and monitors and equipment checked  Epidural Patient position: sitting Prep: site prepped and draped and DuraPrep Patient monitoring: continuous pulse ox and blood pressure Approach: midline Location: L2-L3 Injection technique: LOR air  Needle:  Needle type: Tuohy  Needle gauge: 17 G Needle length: 9 cm and 9 Needle insertion depth: 3 cm Catheter type: closed end flexible Catheter size: 19 Gauge Catheter at skin depth: 8 cm Test dose: negative  Assessment Events: blood not aspirated, injection not painful, no injection resistance, negative IV test and no paresthesia  Additional Notes Dosing of Epidural:  1st dose, through catheter ............................................Marland Kitchen. Xylocaine 30 mg  2nd dose, through catheter, after waiting 3 minutes.......Marland Kitchen.Xylocaine 50 mg   ( 1% Xylo charted as a single dose in Epic Meds for ease of charting; actual dosing was fractionated as above, for saftey's sake)  As each dose occurred, patient was free of IV sx; and patient exhibited no evidence of SA injection.  Patient is more comfortable after epidural dosed. Please see RN's note for documentation of vital signs,and FHR which are stable.  Patient reminded not to try to ambulate with numb legs, and that an RN must be present the 1st time she attempts to get up.

## 2014-09-22 NOTE — Anesthesia Preprocedure Evaluation (Addendum)
Anesthesia Evaluation  Patient identified by MRN, date of birth, ID band Patient awake    Reviewed: Allergy & Precautions, H&P , Patient's Chart, lab work & pertinent test results  Airway Mallampati: II TM Distance: >3 FB Neck ROM: full    Dental  (+) Teeth Intact   Pulmonary  breath sounds clear to auscultation        Cardiovascular Rhythm:regular Rate:Normal     Neuro/Psych    GI/Hepatic   Endo/Other  diabetes, Gestational  Renal/GU      Musculoskeletal   Abdominal   Peds  Hematology   Anesthesia Other Findings       Reproductive/Obstetrics (+) Pregnancy                           Anesthesia Physical Anesthesia Plan  ASA: II  Anesthesia Plan: Epidural   Post-op Pain Management:    Induction:   Airway Management Planned:   Additional Equipment:   Intra-op Plan:   Post-operative Plan:   Informed Consent: I have reviewed the patients History and Physical, chart, labs and discussed the procedure including the risks, benefits and alternatives for the proposed anesthesia with the patient or authorized representative who has indicated his/her understanding and acceptance.   Dental Advisory Given  Plan Discussed with:   Anesthesia Plan Comments: (Labs checked- platelets confirmed with RN in room. Fetal heart tracing, per RN, reported to be stable enough for sitting procedure. Discussed epidural, and patient consents to the procedure:  included risk of possible headache,backache, failed block, allergic reaction, and nerve injury. This patient was asked if she had any questions or concerns before the procedure started.)        Anesthesia Quick Evaluation  

## 2014-09-22 NOTE — H&P (Signed)
LABOR ADMISSION HISTORY AND PHYSICAL  Phyllis [name] is a 28 y.o. female G1P0 with IUP at 3156w4d by 687w2d sono presenting in active labor. She reports. She plans on breast feeding. She is undecided for birth control.  Dating: By 5387w2d sono --->  Estimated Date of Delivery: 09/25/14  Prenatal History/Complications:  Past Medical History: Past Medical History  Diagnosis Date  . Gestational diabetes mellitus, antepartum     Past Surgical History: Past Surgical History  Procedure Laterality Date  . No past surgeries      Obstetrical History: OB History    Gravida Para Term Preterm AB TAB SAB Ectopic Multiple Living   1         0      Social History: History   Social History  . Marital Status: Married    Spouse Name: N/A    Number of Children: N/A  . Years of Education: N/A   Social History Main Topics  . Smoking status: Never Smoker   . Smokeless tobacco: Never Used  . Alcohol Use: No     Comment: occassionally  . Drug Use: No  . Sexual Activity:    Partners: Male   Other Topics Concern  . None   Social History Narrative    Family History: Family History  Problem Relation Age of Onset  . Multiple sclerosis Mother   . Hypertension Paternal Aunt   . Cancer Maternal Grandmother     breast  . Cancer Maternal Aunt     breast  . Asthma Other   . Diabetes Other     Allergies: No Known Allergies  Prescriptions prior to admission  Medication Sig Dispense Refill Last Dose  . glucose blood test strip Use as instructed 100 each 12 Taking  . Lancets (ONETOUCH ULTRASOFT) lancets Use as instructed 100 each 12 Taking  . Prenat w/o A Vit-FeFum-FePo-FA (CONCEPT OB) 130-92.4-1 MG CAPS Take 1 tablet by mouth daily. 30 capsule 12 09/20/2014     Review of Systems   All systems reviewed and negative except as stated in HPI  Blood pressure 110/77, pulse 88, temperature 98.5 F (36.9 C), temperature source Oral, resp. rate 18, height 5' (1.524 m), weight 124 lb (56.246  kg), last menstrual period 12/11/2013, SpO2 100 %. General appearance: alert and cooperative Lungs: clear to auscultation bilaterally Heart: regular rate and rhythm Abdomen: soft, non-tender; bowel sounds normal Extremities: Homans sign is negative, no sign of DVT Presentation: cephalic Fetal monitoringBaseline: 140 bpm, Variability: Good {> 6 bpm), Accelerations: nonreactive but reassuring and Decelerations: few variables Uterine activity q2-624min  Dilation: 5.5 Effacement (%): 70 Station: -2 Exam by:: Phyllis [name]   Prenatal labs: ABO, Rh: --/--/PENDING (12/23 0925) Antibody: NEG (12/23 0925) Rubella:   RPR: NON REAC (09/28 1100)  HBsAg: NEGATIVE (06/22 1144)  HIV: NONREACTIVE (09/14 1124)  GBS: Negative (11/30 0000)  1 hr Glucola 157; 88/207/200/171 Genetic screening  declined Anatomy US normal, 57%   Clinic KV  Dating 6.2 week US  Genetic Screen 1 Screen: Too late         AFP:                    Quad:   Declined     NIPS:  Anatomic US Normal, but limited @ 19 wks >> followup US normal, Placenta posterior  GTT Early:               Third trimester: 157. Needs 3 hour GTT--Dx GDM  TDaP vaccine 06/28/14  Flu vaccine 9.28.15  GBS neg  Contraception   Baby Food   Circumcision   Pediatrician   Support Person Thurston Poundsrey      Results for orders placed or performed during the hospital encounter of 09/22/14 (from the past 24 hour(s))  CBC   Collection Time: 09/22/14  9:25 AM  Result Value Ref Range   WBC 15.0 (H) 4.0 - 10.5 K/uL   RBC 4.81 3.87 - 5.11 MIL/uL   Hemoglobin 11.2 (L) 12.0 - 15.0 g/dL   HCT 56.234.1 (L) 13.036.0 - 86.546.0 %   MCV 70.9 (L) 78.0 - 100.0 fL   MCH 23.3 (L) 26.0 - 34.0 pg   MCHC 32.8 30.0 - 36.0 g/dL   RDW 78.415.4 69.611.5 - 29.515.5 %   Platelets 234 150 - 400 K/uL  Type and screen   Collection Time: 09/22/14  9:25 AM  Result Value Ref Range   ABO/RH(D) PENDING    Antibody Screen NEG    Sample Expiration 09/25/2014   Glucose, capillary   Collection Time: 09/22/14  9:35  AM  Result Value Ref Range   Glucose-Capillary 103 (H) 70 - 99 mg/dL    Patient Active Problem List   Diagnosis Date Noted  . Active labor 09/22/2014  . Diet controlled gestational diabetes mellitus in third trimester   . Gestational diabetes mellitus, antepartum 07/05/2014  . Preterm uterine contractions in second trimester, antepartum 06/29/2014  . Chlamydia infection affecting pregnancy in first trimester, antepartum 03/23/2014  . Supervision of normal pregnancy in first trimester 03/22/2014    Assessment: Phyllis Dwan BoltM Persinger is a 28 y.o. G1P0 at 6348w4d here in active labor having made cervical change in MAU  #Labor: expectant mgmt, now 5-6/70/-2, if no change at next exam will AROM #Pain: Fentanyl prn #FWB: Cat I #ID:  GBS neg #MOF: breast #MOC:undecided GDM: A1DM q4h CBG  Phyllis [name] ROCIO 09/22/2014, 11:02 AM

## 2014-09-23 LAB — GLUCOSE, CAPILLARY: Glucose-Capillary: 82 mg/dL (ref 70–99)

## 2014-09-23 NOTE — Lactation Note (Addendum)
This note was copied from the chart of Phyllis [name]. Lactation Consultation Note  Patient Name: Phyllis [place] Today's Date: 09/23/2014 Reason for consult: Initial assessment  Baby is 18 hours old,  And has been to the breast , but per mom  Tender with latching , so LC reviewed basics , breast massage, hand express, pre pump with hand pump to enhance   the flow and to make the nipple and areola more elastic for  A  deeper latch. Baby latched with depth and several swallows noted , per Mom comfortable. Swallows increased with breast compressions. LC recommended prior to every latch breast massage, hand express, prepump  As a preventive measure of soreness due to moms sensitivity.  Mother informed of post-discharge support and given phone number to the lactation department, including services for phone call assistance; out-patient appointments; and breastfeeding support group. List of other breastfeeding resources in the community given in the handout. Encouraged mother to call for problems or concerns related to breastfeeding.   Maternal Data Has patient been taught Hand Expression?: Yes Does the patient have breastfeeding experience prior to this delivery?: No  Feeding Feeding Type: Breast Fed Length of feed: 10 min  LATCH Score/Interventions Latch: Grasps breast easily, tongue down, lips flanged, rhythmical sucking. Intervention(s): Adjust position;Assist with latch;Breast massage;Breast compression  Audible Swallowing: A few with stimulation  Type of Nipple: Everted at rest and after stimulation  Comfort (Breast/Nipple): Soft / non-tender     Hold (Positioning): Assistance needed to correctly position infant at breast and maintain latch. Intervention(s): Breastfeeding basics reviewed;Support Pillows;Position options;Skin to skin  LATCH Score: 8  Lactation Tools Discussed/Used Tools: Pump Breast pump type: Manual Pump Review: Setup, frequency, and cleaning Initiated  by:: MAI  Date initiated:: 09/23/14   Consult Status Consult Status: Follow-up Date: 09/23/14 Follow-up type: In-patient    Kathrin Greathouseorio, Nabor Thomann Ann 09/23/2014, 3:05 PM

## 2014-09-23 NOTE — Lactation Note (Signed)
This note was copied from the chart of Phyllis [name]. Lactation Consultation Note  Patient Name: Phyllis [place] Barlett ZOXWR'UToday's Date: 09/23/2014 Reason for consult: [facility].  LC had assisted this mom earlier and with her help, a deeper latch was achieved.  Mom says she hand expressed her colostrum and placed baby STS for recent feeding attempt, but he was too sleepy.  LC discussed normal newborn sleepiness and encouraged continued cue feedings and STS.  LC recommends mom request her nure first and that nurse will notify LC if needed.   Maternal Data    Feeding Feeding Type: Breast Fed  LATCH Score/Interventions         Most recent LATCH score=8 per LC assessment and baby has had 4 feeds since birth             Lactation Tools Discussed/Used   Cue feedings, hand expression, STS  Consult Status Consult Status: Follow-up Date: 09/24/14 Follow-up type: In-patient    Warrick ParisianBryant, Malakye Nolden [facility] 09/23/2014, 7:49 PM

## 2014-09-23 NOTE — Progress Notes (Signed)
Post Partum Day 1 Subjective: no complaints, up ad lib, voiding and tolerating PO  Objective: Blood pressure 107/72, pulse 65, temperature 97.7 F (36.5 C), temperature source Oral, resp. rate 16, height 5' (1.524 m), weight 124 lb (56.246 kg), last menstrual period 12/11/2013, SpO2 100 %, unknown if currently breastfeeding.  Physical Exam:  General: alert, cooperative and no distress Lochia: appropriate Uterine Fundus: firm Incision: healing well DVT Evaluation: No evidence of DVT seen on physical exam.   Recent Labs  09/22/14 0925  HGB 11.2*  HCT 34.1*    Assessment/Plan: Plan for discharge tomorrow and Breastfeeding   LOS: 1 day   [facility] Center[facility]ILLIAMS,Jillienne Egner 09/23/2014, 6:53 AM

## 2014-09-23 NOTE — Anesthesia Postprocedure Evaluation (Signed)
  Anesthesia Post-op Note  Patient: Phyllis [name]  Procedure(s) Performed: * No procedures listed *  Patient Location: Mother/Baby  Anesthesia Type:Epidural  Level of Consciousness: awake, alert , oriented and patient cooperative  Airway and Oxygen Therapy: Patient Spontanous Breathing  Post-op Pain: mild  Post-op Assessment: Post-op Vital signs reviewed, Patient's Cardiovascular Status Stable, Respiratory Function Stable, Patent Airway, No headache, No backache, No residual numbness and No residual motor weakness  Post-op Vital Signs: Reviewed and stable  Last Vitals:  Filed Vitals:   09/23/14 0506  BP: 107/72  Pulse: 65  Temp: 36.5 C  Resp: 16    Complications: No apparent anesthesia complications

## 2014-09-23 NOTE — Progress Notes (Signed)
MOB was referred for history of depression/anxiety.  Referral is screened out by Clinical Social Worker because none of the following criteria appear to apply: -History of anxiety/depression during this pregnancy, or of post-partum depression. - Diagnosis of anxiety and/or depression within last 3 years - History of depression due to pregnancy loss/loss of child or -MOB's symptoms are currently being treated with medication and/or therapy.  CSW completed chart review and did not see documentation of history of depression/anxiety.  CSW spoke to RN, who denied awareness of diagnosis or history.    Please contact the Clinical Social Worker if needs arise or upon MOB request.  

## 2014-09-24 LAB — GLUCOSE, CAPILLARY
GLUCOSE-CAPILLARY: 73 mg/dL (ref 70–99)
Glucose-Capillary: 157 mg/dL — ABNORMAL HIGH (ref 70–99)

## 2014-09-24 MED ORDER — IBUPROFEN 600 MG PO TABS: 600.0000 mg | ORAL_TABLET | Freq: Four times a day (QID) | ORAL | Status: DC

## 2014-09-24 MED ORDER — CONCEPT OB 130-92.4-1 MG PO CAPS: 1.0000 | ORAL_CAPSULE | Freq: Every day | ORAL | Status: DC

## 2014-09-24 NOTE — Discharge Summary (Signed)
  Obstetric Discharge Summary Reason for Admission: induction of labor Prenatal Procedures: ultrasound Intrapartum Procedures: spontaneous vaginal delivery Postpartum Procedures: none Complications-Operative and Postpartum: labial laceration  Delivery Note At 9:02 PM a viable female was delivered via Vaginal, Spontaneous Delivery (Presentation: Left Occiput Anterior).  APGAR: 9, 9; weight 6 lb 1.5 oz (2765 g).   Placenta status: Intact, Spontaneous.  Cord: 3 vessels with the following complications: None.   Anesthesia: Epidural  Episiotomy: None Lacerations: Labial Suture Repair: 4-0 monocryl Est. Blood Loss (mL): 60  Mom to postpartum.  Baby to Couplet care / Skin to Skin.  Henri Baumler ROCIO 09/24/2014, 6:43 AM     Hospital Course:  Active Problems:   Active labor   [place] Dwan BoltM Brickhouse is a 28 y.o. G1P1001 s/p IOL 2/2 A1DM.  She has postpartum course that was uncomplicated including no problems with ambulating, PO intake, urination, pain, or bleeding. The pt feels ready to go home and  will be discharged with outpatient follow-up.   Today: No acute events overnight.  Pt denies problems with ambulating, voiding or po intake.  She denies nausea or vomiting.  Pain is well controlled.   Plan for birth control is  undecided, considering all options at this time.  Method of Feeding: breast   H/H: Lab Results  Component Value Date/Time   HGB 11.2* 09/22/2014 09:25 AM   HCT 34.1* 09/22/2014 09:25 AM    Discharge Diagnoses: Term Pregnancy-delivered  Discharge Information: Date: 09/24/2014 Activity: pelvic rest Diet: routine  Medications: None Breast feeding:  Yes Condition: stable Instructions: refer to handout Discharge to: home   Discharge Instructions    Call MD for:  redness, tenderness, or signs of infection (pain, swelling, redness, odor or green/yellow discharge around incision site)    Complete by:  As directed      Call MD for:  severe uncontrolled pain     Complete by:  As directed      Call MD for:  temperature >100.4    Complete by:  As directed      Diet - low sodium heart healthy    Complete by:  As directed             Medication List    STOP taking these medications        glucose blood test strip     onetouch ultrasoft lancets      TAKE these medications        CONCEPT OB 130-92.4-1 MG Caps  Take 1 tablet by mouth daily.           Follow-up Information    [facility] [place] [facility].   Specialty:  Obstetrics and Gynecology   Contact information:   [address], [address] [place] [id number] [phone]      Perry MountACOSTA,Jasara Corrigan ROCIO ,MD OB Fellow 09/24/2014,6:43 AM

## 2014-09-24 NOTE — Progress Notes (Signed)
Patient refused this morning's blood sugar. She states that her last blood sugar was normal and she states that she is tired of being pricked. Patient states that her doctor already discharged her anyway. Earl Galasborne, Linda HedgesStefanie HendersonHudspeth

## 2014-09-24 NOTE — Lactation Note (Signed)
This note was copied from the chart of Phyllis [name]. Lactation Consultation Note  Patient Name: Phyllis [place] Costanza WUJWJ'XToday's Date: 09/24/2014 Reason for consult: Follow-up assessment;Infant < 6lbs Mom reports some nipple tenderness on left nipple. LC offered to observe feeding to help with latch, Mom declined. Mom reports she feels the baby is getting more depth now with latch and she knows what to check for. Care for sore nipples reviewed. Mom requested another pair of comfort gels. LC advised Mom if the pain is not resolving she needs to call for OP f/u. Engorgement care reviewed if needed. Advised of support group. Mom denies other questions or concerns.   Maternal Data    Feeding    LATCH Score/Interventions                      Lactation Tools Discussed/Used Tools: Comfort gels   Consult Status Consult Status: Complete Date: 09/24/14 Follow-up type: In-patient    Alfred LevinsGranger, Jhana Giarratano Ann 09/24/2014, 12:08 PM

## 2014-09-24 NOTE — Lactation Note (Signed)
This note was copied from the chart of Phyllis [name]. Lactation Consultation Note  Patient Name: Phyllis [name] UJWJX'BToday's Date: 09/24/2014 Reason for consult: Follow-up assessment;Infant < 6lbs RN gave Mom comfort gels for nipple pain.   Maternal Data    Feeding    LATCH Score/Interventions                      Lactation Tools Discussed/Used Tools: Comfort gels   Consult Status Consult Status: Complete Date: 09/24/14 Follow-up type: In-patient    Alfred LevinsGranger, [name] Ann 09/24/2014, 12:07 PM

## 2014-09-24 NOTE — Discharge Instructions (Signed)

## 2014-09-25 ENCOUNTER — Inpatient Hospital Stay (HOSPITAL_COMMUNITY): Payer: No Typology Code available for payment source

## 2014-09-28 ENCOUNTER — Ambulatory Visit: Payer: Self-pay

## 2014-09-28 ENCOUNTER — Telehealth (HOSPITAL_COMMUNITY): Payer: Self-pay

## 2014-09-28 NOTE — Lactation Note (Signed)
This note was copied from the chart of Phyllis [name]. Lactation Consult  A message was received from Phyllis [name]'s pediatrician regarding lactation support.  Contact numbers for his mother and father are not working.  I used his mom's emergency contact number to leave a message for her to call the lactation department.  I also contacted the pediatrician listed in the discharge note but they are not the pediatrician that called us as they have no record of him as a patient

## 2014-10-05 ENCOUNTER — Encounter: Payer: Self-pay | Admitting: *Deleted

## 2014-10-16 NOTE — Telephone Encounter (Signed)
No further information for documentation.

## 2014-11-01 ENCOUNTER — Encounter: Payer: Self-pay | Admitting: Advanced Practice Midwife

## 2014-11-01 ENCOUNTER — Ambulatory Visit (INDEPENDENT_AMBULATORY_CARE_PROVIDER_SITE_OTHER): Payer: BLUE CROSS/BLUE SHIELD | Admitting: Advanced Practice Midwife

## 2014-11-01 ENCOUNTER — Encounter: Payer: Self-pay | Admitting: *Deleted

## 2014-11-01 VITALS — BP 119/83 | HR 69 | Resp 16 | Ht 60.0 in | Wt 109.0 lb

## 2014-11-01 DIAGNOSIS — O872 Hemorrhoids in the puerperium: Secondary | ICD-10-CM

## 2014-11-01 MED ORDER — HYDROCORTISONE ACE-PRAMOXINE 1-1 % RE FOAM: 1.0000 | Freq: Two times a day (BID) | RECTAL | Status: DC

## 2014-11-01 MED ORDER — DOCUSATE SODIUM 100 MG PO CAPS: 100.0000 mg | ORAL_CAPSULE | Freq: Two times a day (BID) | ORAL | Status: DC | PRN

## 2014-11-01 NOTE — Patient Instructions (Signed)

## 2014-11-04 NOTE — Progress Notes (Signed)
  Subjective:     Phyllis [name] is a 29 y.o. female who presents for a postpartum visit. She is 5 weeks postpartum following a spontaneous vaginal delivery. I have fully reviewed the prenatal and intrapartum course. The delivery was at 39 weeks. Outcome: spontaneous vaginal delivery. Anesthesia: epidural. Postpartum course has been complicated by few episodes of bright red rectal bleeding and possible hemorrhoids. Baby's course has been uncomplicated. Baby is feeding by breast. Bleeding stopped 1.5 weeks ago. Resumed menses 10/28/14.. Bowel function is abnormal: constipation. Bladder function is normal. Patient is sexually active. Contraception method is condoms. Postpartum depression screening: negative.  The following portions of the patient's history were reviewed and updated as appropriate: allergies, current medications, past family history, past medical history, past social history, past surgical history and problem list.  Review of Systems A comprehensive review of systems was negative.   Objective:    BP 119/83 mmHg  Pulse 69  Resp 16  Ht 5' (1.524 m)  Wt 49.442 kg (109 lb)  BMI 21.29 kg/m2  LMP 10/28/2014  Breastfeeding? Yes  General:  alert, cooperative, appears stated age and no distress   Breasts:  inspection negative, no nipple discharge or bleeding, no masses or nodularity palpable  Lungs: clear to auscultation bilaterally  Heart:  regular rate and rhythm, S1, S2 normal, no murmur, click, rub or gallop  Abdomen: soft, non-tender; bowel sounds normal; no masses,  no organomegaly   Vulva:  normal and well-healed labial lac  Vagina: small amount of dark red bleeding  Cervix:  not examined  Corpus: not examined  Adnexa:  not evaluated  Rectal Exam: internal hemorrhoids        Assessment:   1. Postpartum hemorrhoids  - hydrocortisone-pramoxine (PROCTOFOAM HC) rectal foam; Place 1 applicator rectally 2 (two) times daily.  Dispense: 10 g; Refill: 1 - docusate sodium (COLACE)  100 MG capsule; Take 1 capsule (100 mg total) by mouth 2 (two) times daily as needed.  Dispense: 30 capsule; Refill: 2   Plan:    1. Contraception: condoms 2. Increase fluids and fiber 3. Follow up in: 1 year or as needed.

## 2015-02-10 IMAGING — US US OB TRANSVAGINAL
1 series · 14 of 28 positions shown · non-contrast
Comparison: 01/18/14

CLINICAL DATA: Pregnancy with inconclusive fetal viability.
Uncertain LMP.

EXAM:
TRANSVAGINAL OB ULTRASOUND
TECHNIQUE: Transvaginal ultrasound was performed for complete evaluation of the
gestation as well as the maternal uterus, adnexal regions, and
pelvic cul-de-sac.

[Series 1: us ob transvaginal · 14 of 50 slices shown]
[im 2/50]
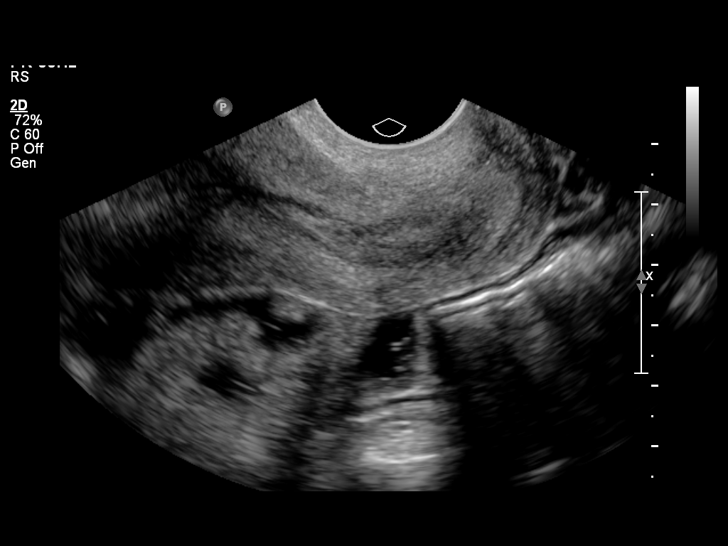
[im 6/50]
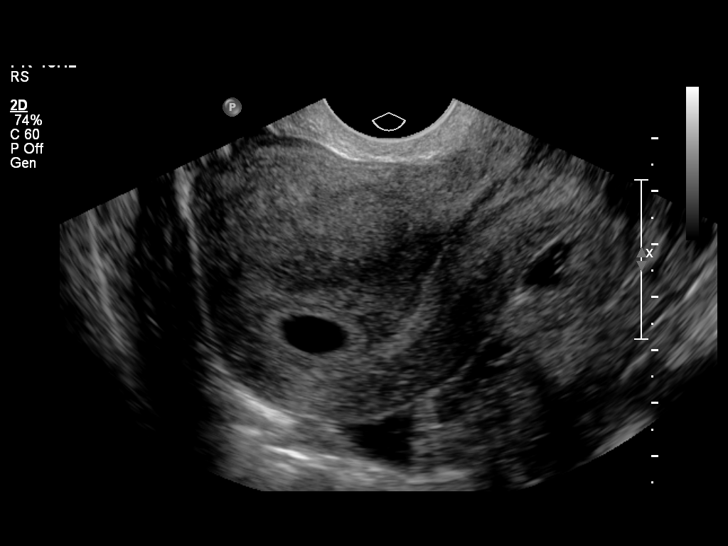
[im 10/50]
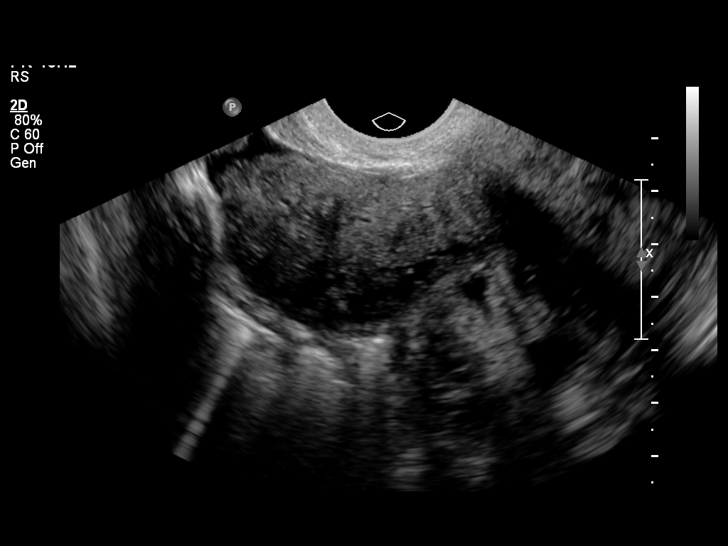
[im 13/50]
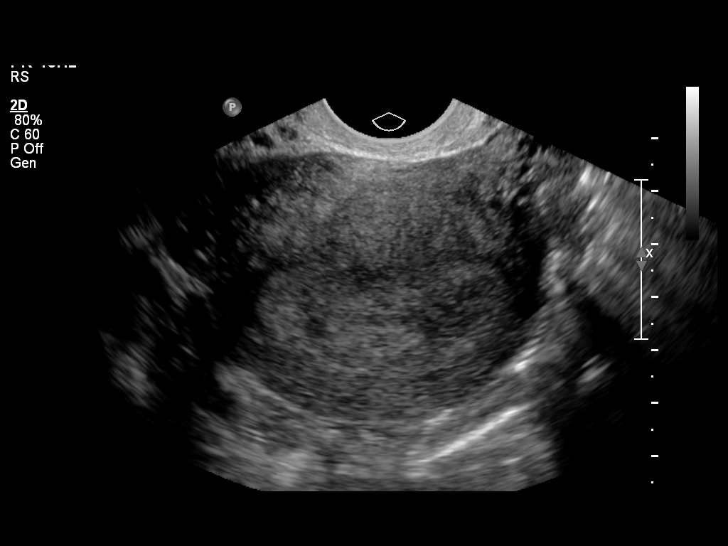
[im 17/50]
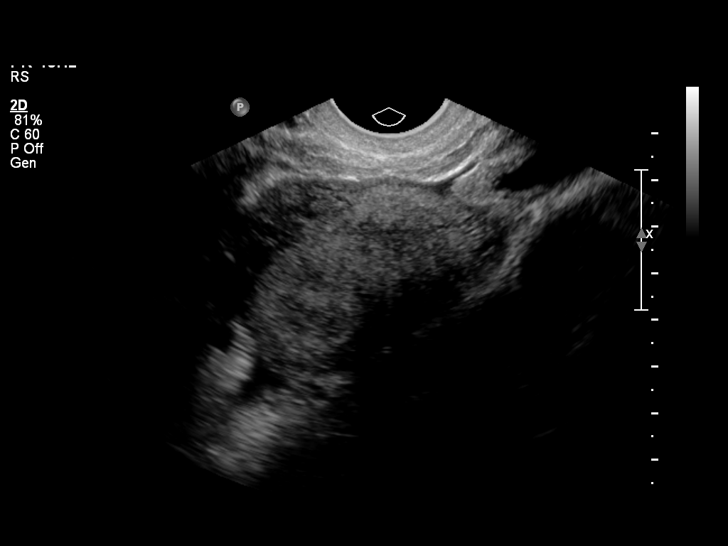
[im 20/50]
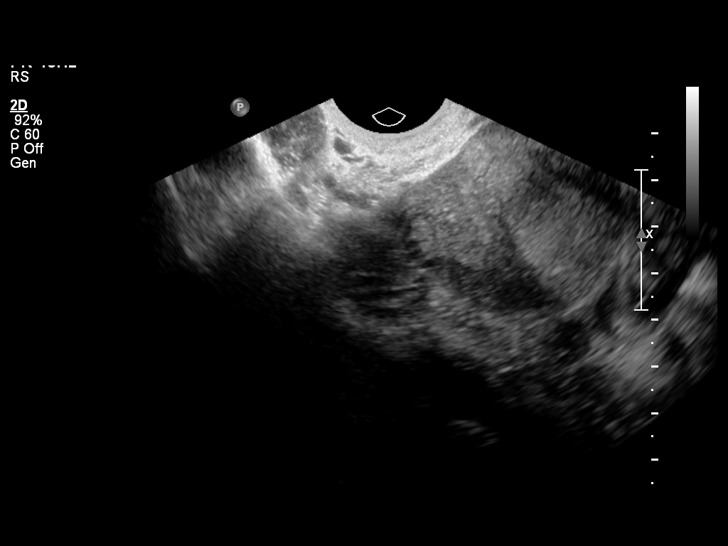
[im 24/50]
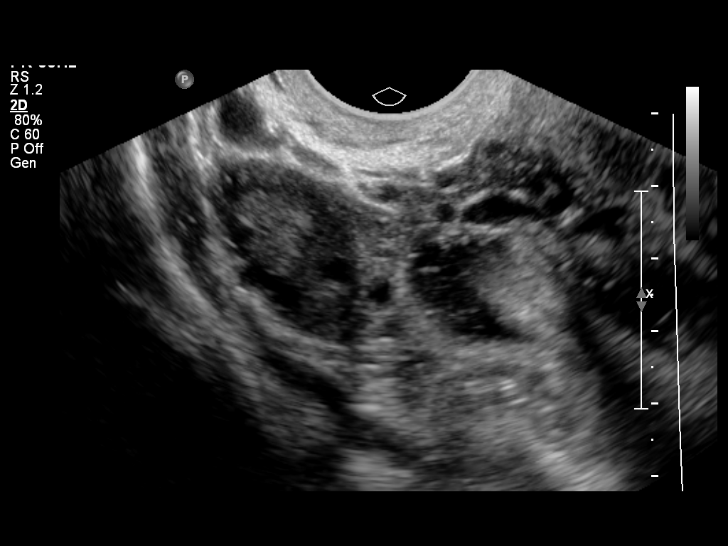
[im 28/50]
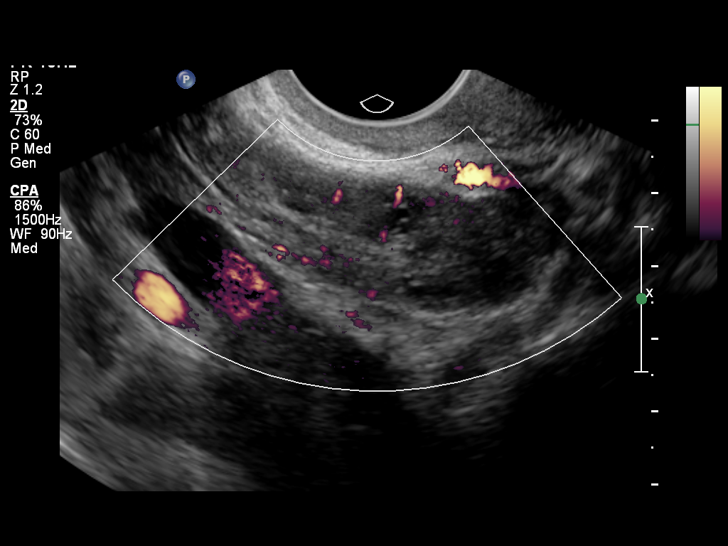
[im 31/50]
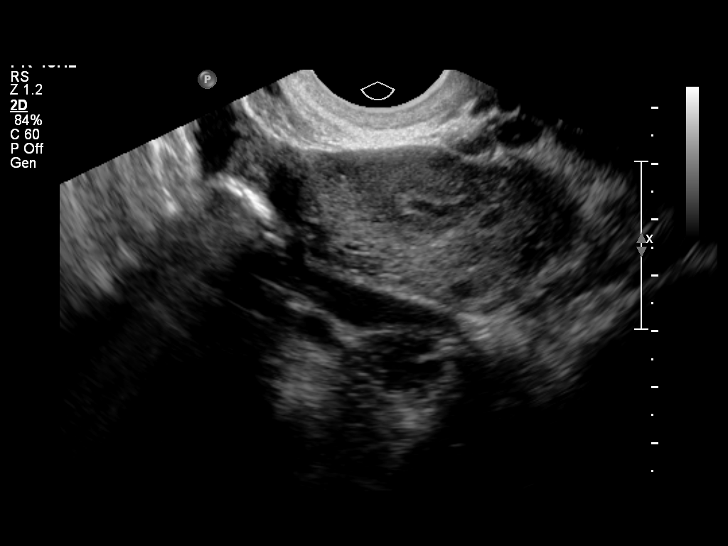
[im 35/50]
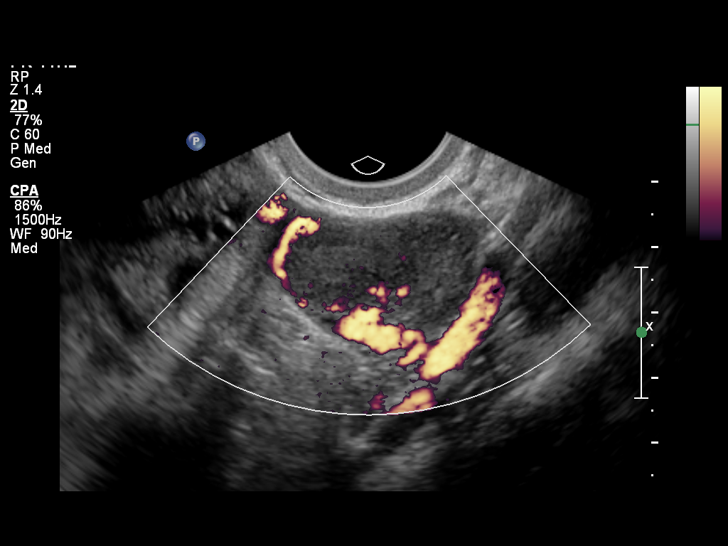
[im 39/50]
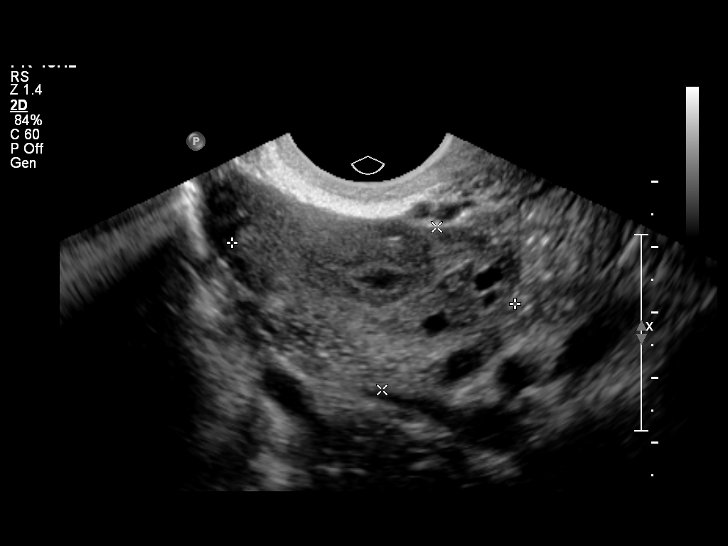
[im 42/50]
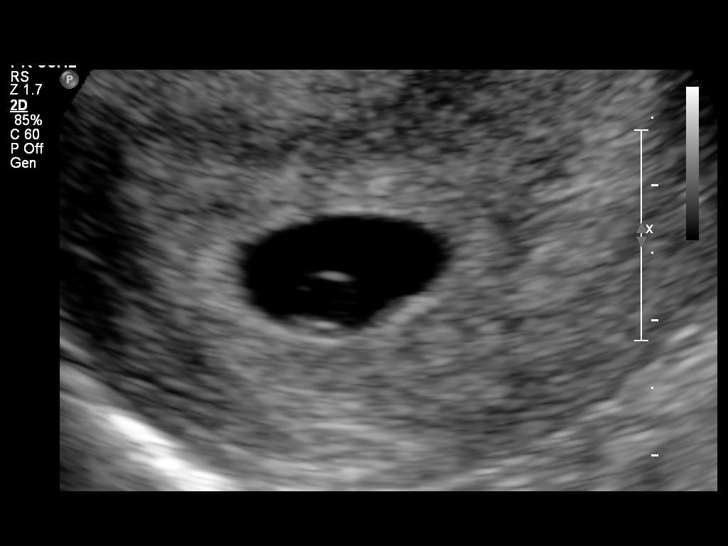
[im 46/50]
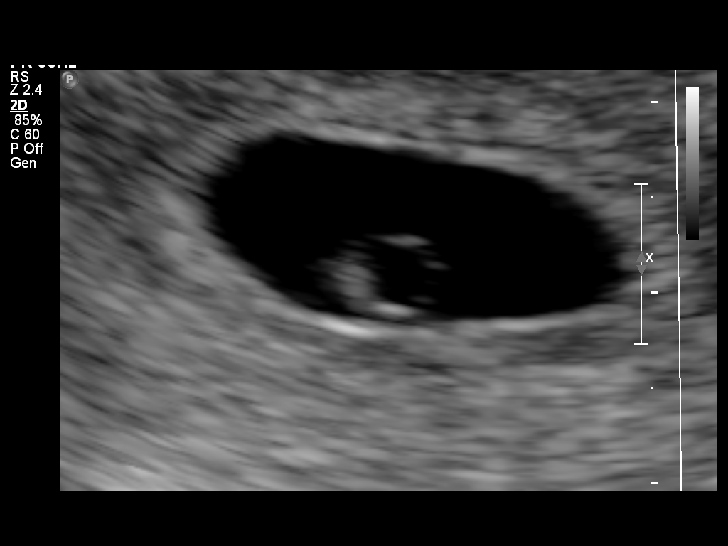
[im 50/50]
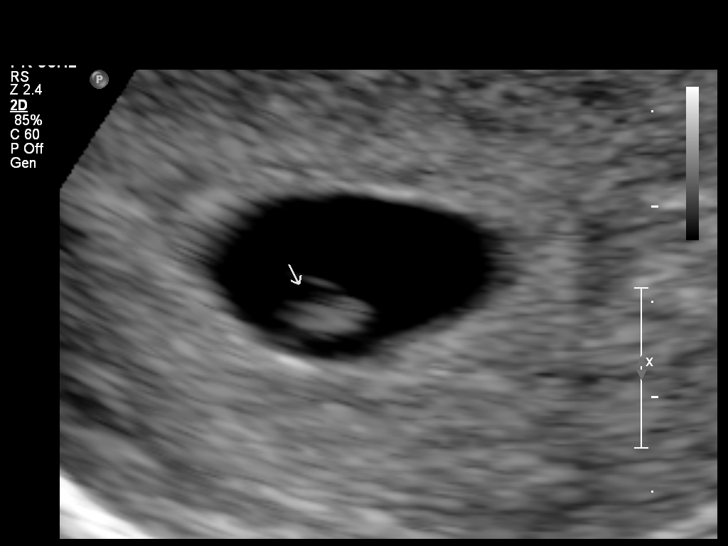

[14 of 28 positions shown; findings below may reference images not displayed]

FINDINGS: Intrauterine gestational sac: Visualized/normal in shape.

Yolk sac:  Visualized

Embryo:  Visualized

Cardiac Activity: Visualized

Heart Rate: 121 bpm

CRL:   5  mm   6 w 2 d                  US EDC: 09/25/2014

Maternal uterus/adnexae: Both ovaries are normal in appearance. No
adnexal mass or free fluid identified.
IMPRESSION: Single living IUP measuring 6 weeks 2 days with US EDC of
09/25/2014.

No significant maternal uterine or adnexal abnormality identified.

## 2015-06-18 IMAGING — US US OB FOLLOW-UP
1 series · 12 of 28 positions shown · non-contrast
Comparison: none

[Series 1: us ob follow up · 12 of 55 slices shown]
[im 3/55]
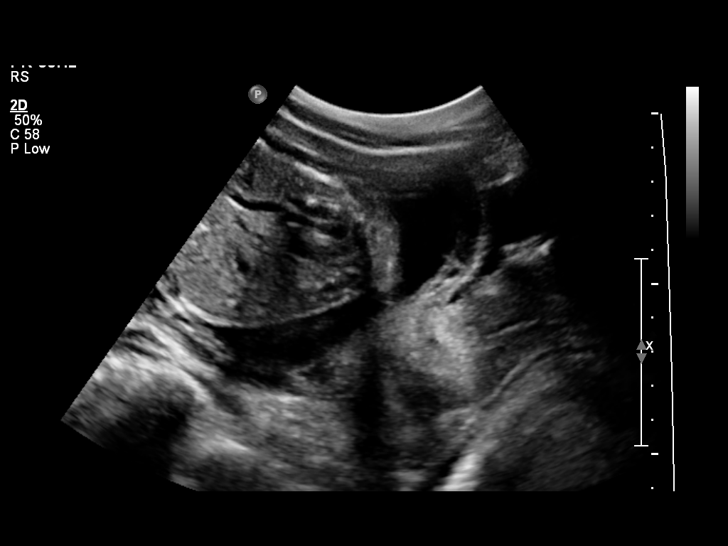
[im 7/55]
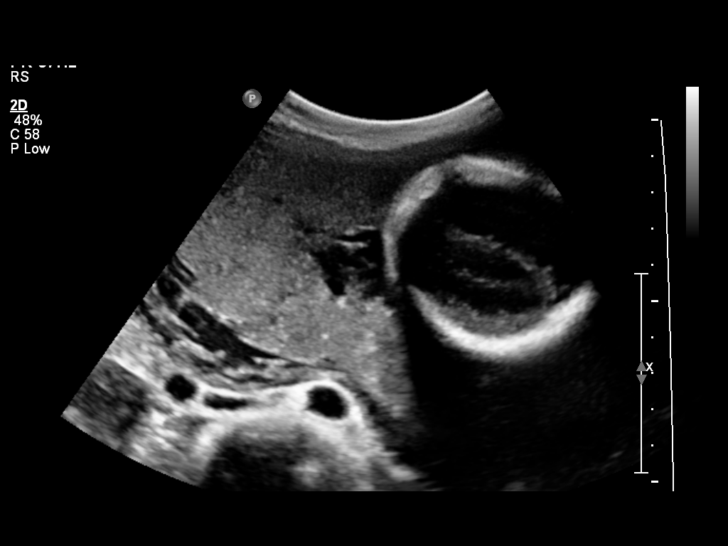
[im 11/55]
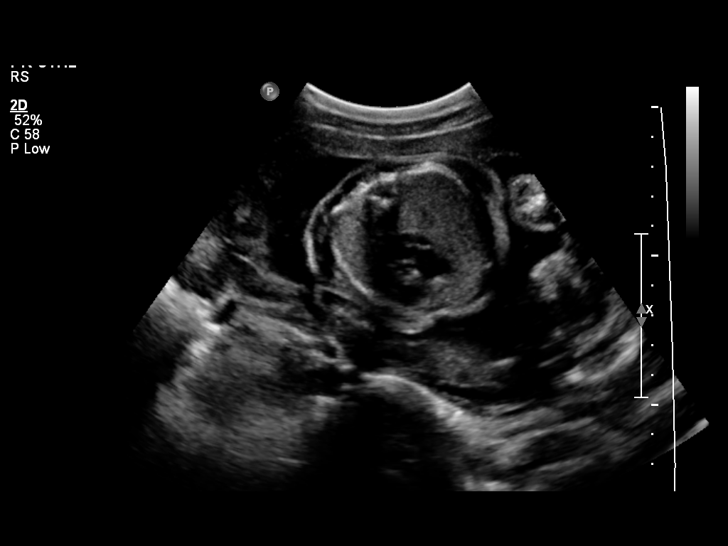
[im 17/55]
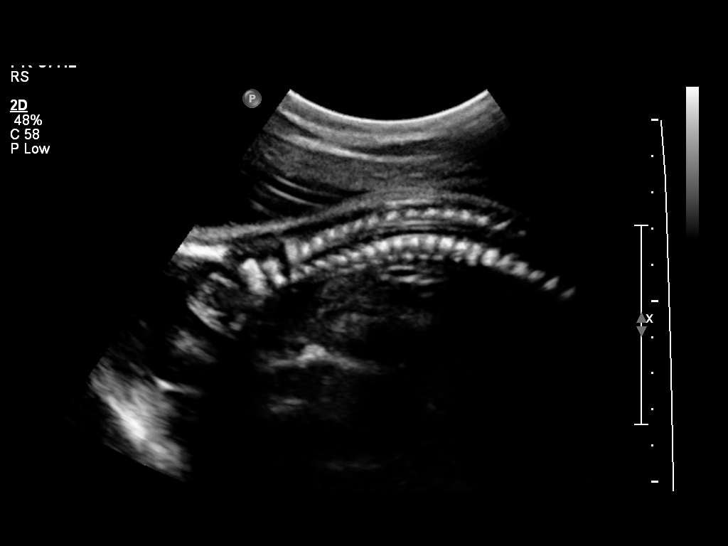
[im 21/55]
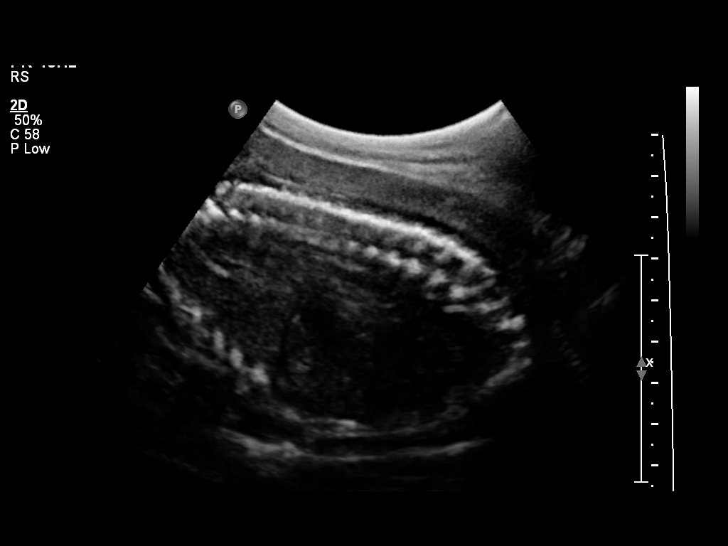
[im 25/55]
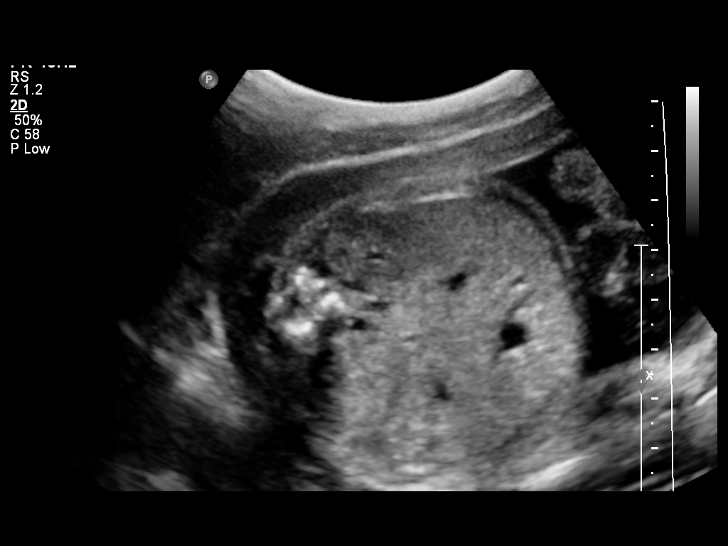
[im 31/55]
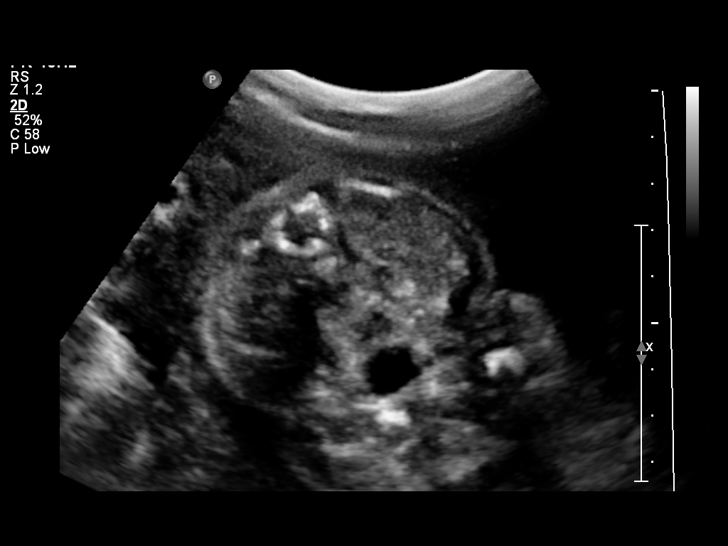
[im 35/55]
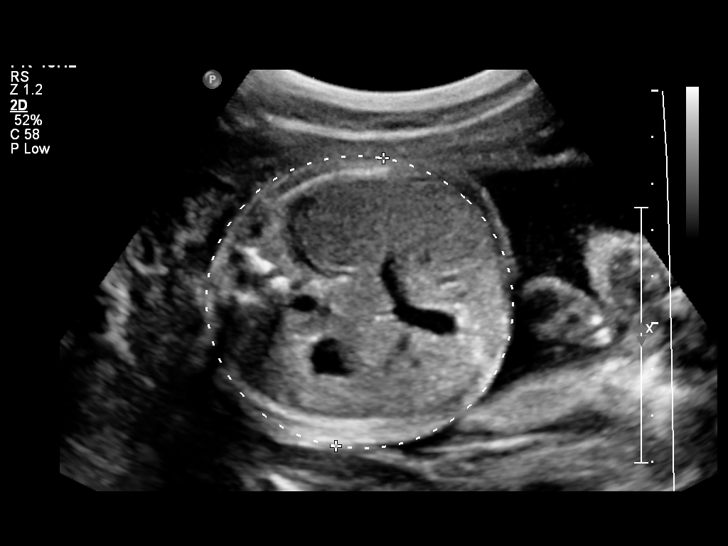
[im 39/55]
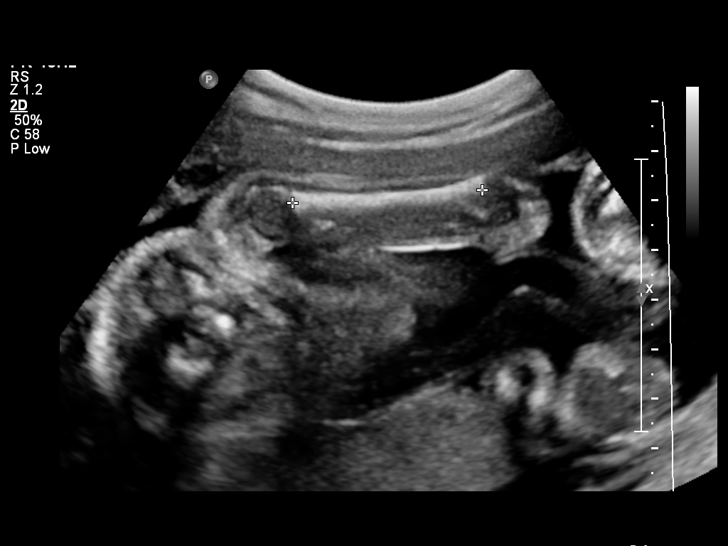
[im 45/55]
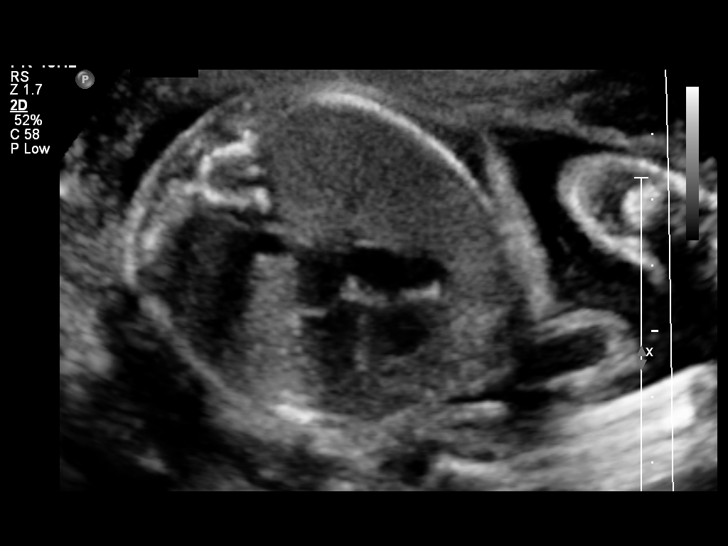
[im 49/55]
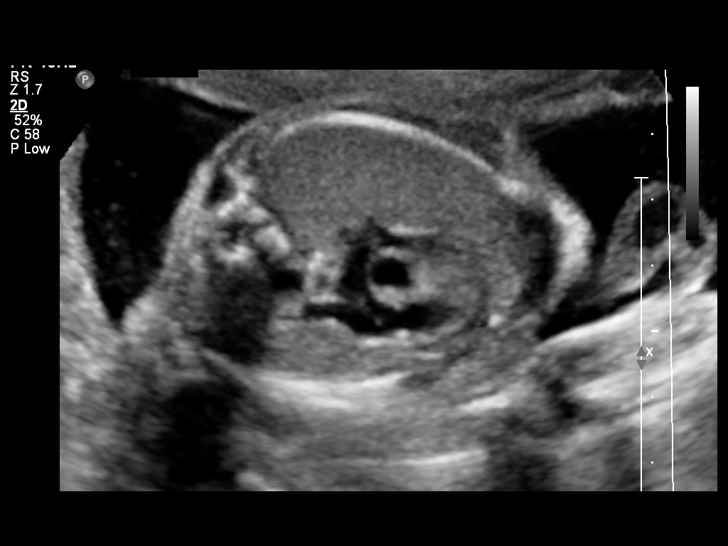
[im 53/55]
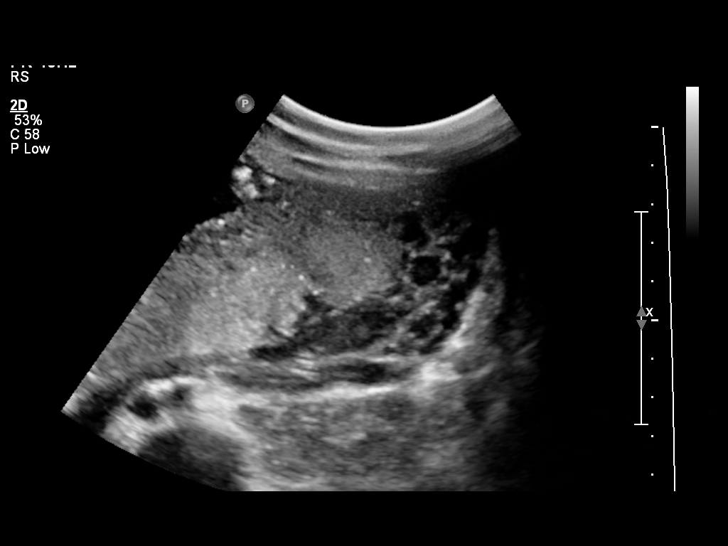

[12 of 28 positions shown; findings below may reference images not displayed]

OBSTETRICS REPORT
                      (Signed Final 06/09/2014 [DATE])

Service(s) Provided

 US OB FOLLOW UP                                       76816.1
Indications

 Follow-up incomplete fetal anatomic evaluation
Fetal Evaluation

 Num Of Fetuses:    1
 Fetal Heart Rate:  155                          bpm
 Cardiac Activity:  Observed
 Presentation:      Breech
 Placenta:          Posterior, above cervical
                    os
 P. Cord            Visualized
 Insertion:

 Amniotic Fluid
 AFI FV:      Subjectively within normal limits
                                             Larg Pckt:    5.39  cm
Biometry

 BPD:       59  mm     G. Age:  24w 1d                CI:        67.83   70 - 86
                                                      FL/HC:      18.2   18.7 -

 HC:     229.2  mm     G. Age:  24w 6d       45  %    HC/AC:      1.12   1.04 -

 AC:     205.1  mm     G. Age:  25w 1d       58  %    FL/BPD:     70.8   71 - 87
 FL:      41.8  mm     G. Age:  23w 4d       13  %    FL/AC:      20.4   20 - 24
 HUM:     38.3  mm     G. Age:  23w 4d       18  %

 Est. FW:     702  gm      1 lb 9 oz     51  %
Gestational Age

 LMP:           25w 5d        Date:  12/11/13                 EDD:   09/17/14
 U/S Today:     24w 3d                                        EDD:   09/26/14
 Best:          24w 4d     Det. By:  Early Ultrasound         EDD:   09/25/14
                                     (02/01/14)
Anatomy

 Cranium:          Appears normal         Aortic Arch:      Previously seen
 Fetal Cavum:      Previously seen        Ductal Arch:      Previously seen
 Ventricles:       Appears normal         Diaphragm:        Previously seen
 Choroid Plexus:   Previously seen        Stomach:          Appears normal, left
                                                            sided
 Cerebellum:       Previously seen        Abdomen:          Previously seen
 Posterior Fossa:  Previously seen        Abdominal Wall:   Previously seen
 Nuchal Fold:      Previously seen        Cord Vessels:     Previously seen
 Face:             Orbits and profile     Kidneys:          Appear normal
                   previously seen
 Lips:             Appears normal         Bladder:          Appears normal
 Heart:            Appears normal         Spine:            Appears normal
                   (4CH, axis, and
                   situs)
 RVOT:             Appears normal         Lower             Previously seen
                                          Extremities:
 LVOT:             Appears normal         Upper             Previously seen
                                          Extremities:

 Other:  Male gender. Right heel and 5th digit visualized. Technically difficult
         due to fetal position.
Cervix Uterus Adnexa

 Cervical Length:    3.5      cm

 Cervix:       Normal appearance by transabdominal scan.
 Uterus:       No abnormality visualized.
 Cul De Sac:   No free fluid seen.
 Left Ovary:    Not visualized.
 Right Ovary:   Not visualized.

 Adnexa:     No abnormality visualized.
Impression

 Single IUP at 24w 4d
 Normal interval anatomy - the anatomic survey is now
 complete
 Interval fetal growth is appropriate (51st %tile)
 Posterior placenta without previa
 Normal amniotic fluid volume
Recommendations

 Follow-up ultrasounds as clinically indicated.

 questions or concerns.

## 2015-09-10 IMAGING — US US OB FOLLOW-UP
1 series · 12 of 28 positions shown · non-contrast
Comparison: none

[Series 1: us ob follow up · 12 of 51 slices shown]
[im 2/51]
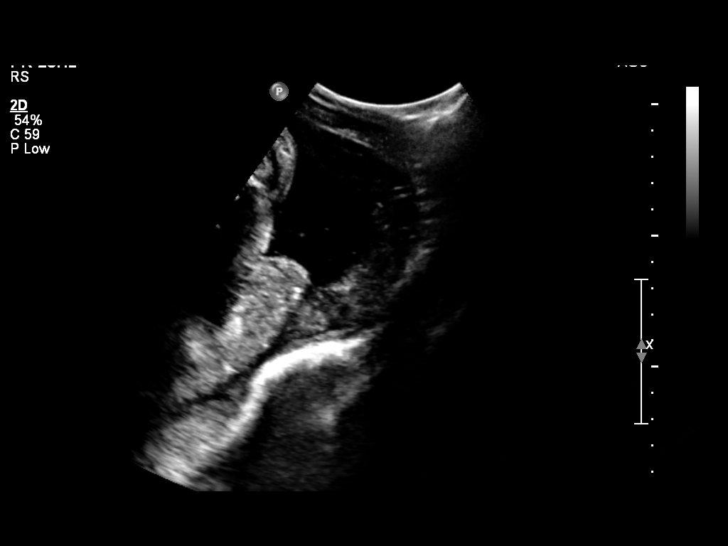
[im 6/51]
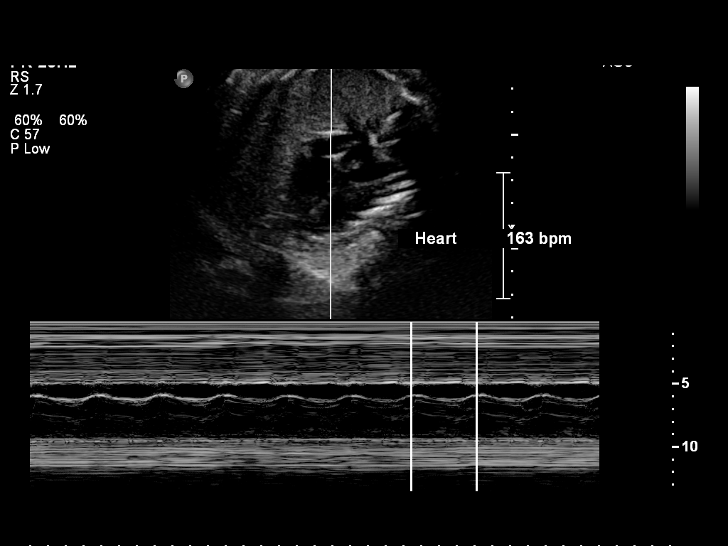
[im 10/51]
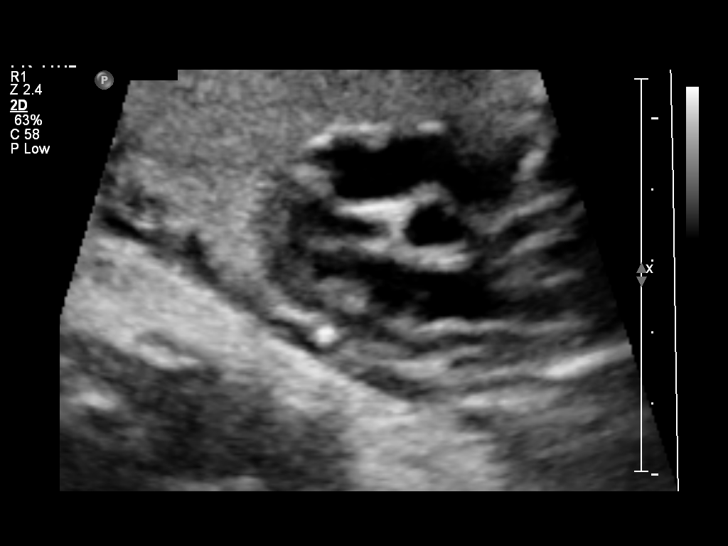
[im 15/51]
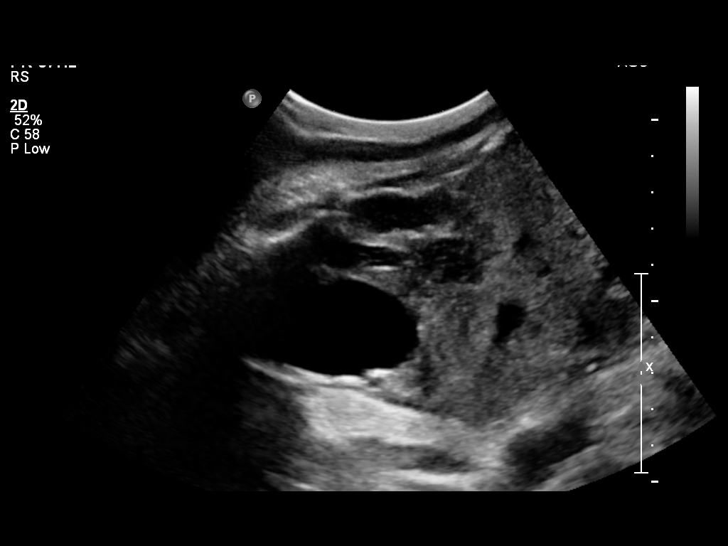
[im 19/51]
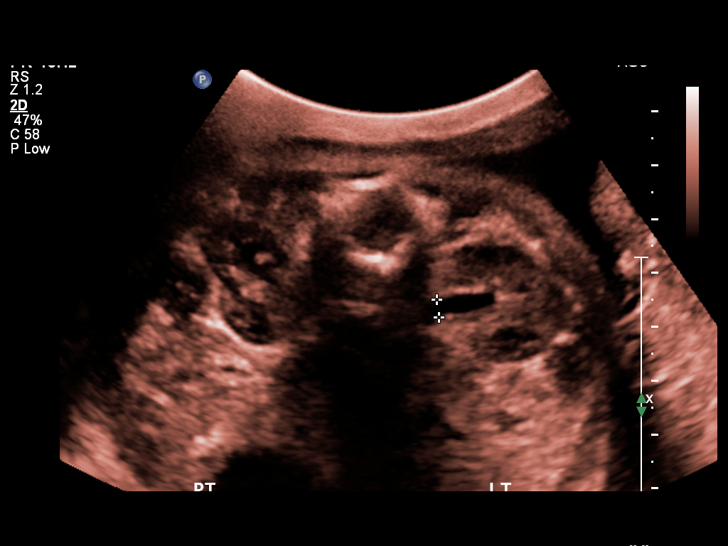
[im 23/51]
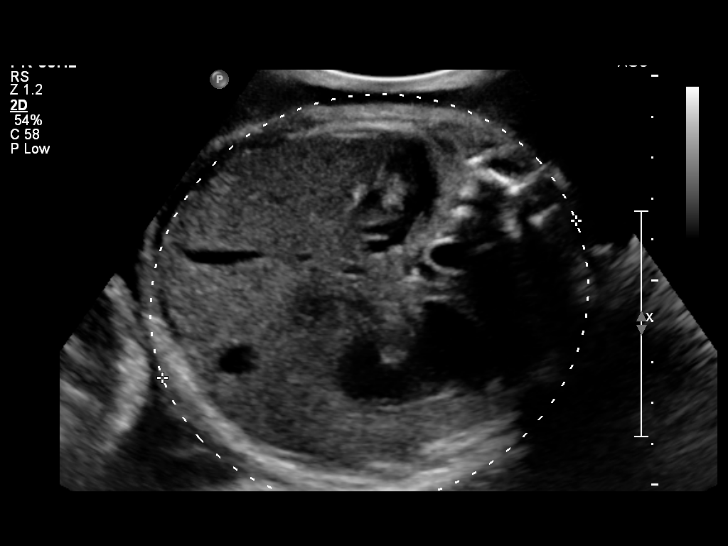
[im 28/51]
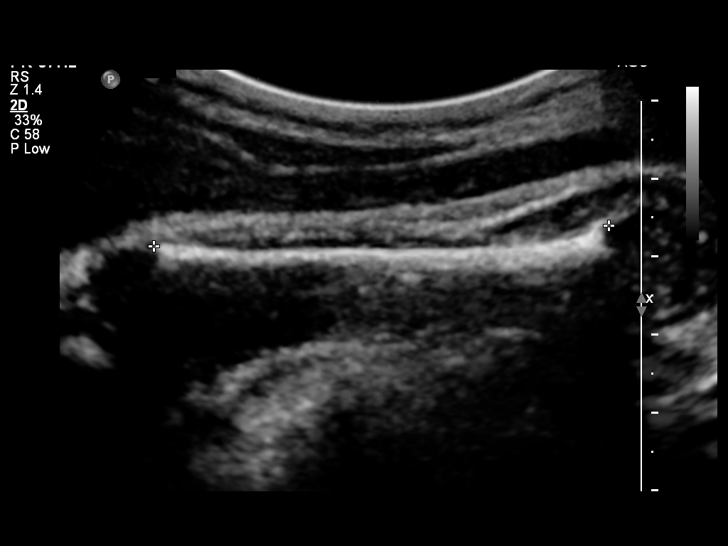
[im 32/51]
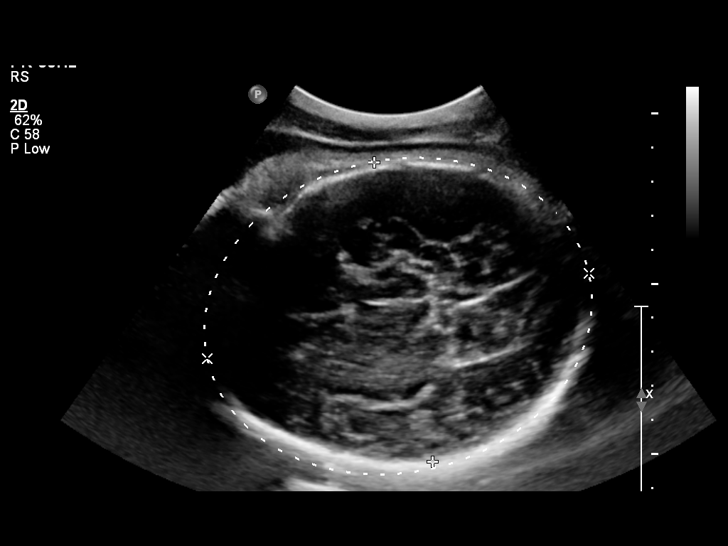
[im 36/51]
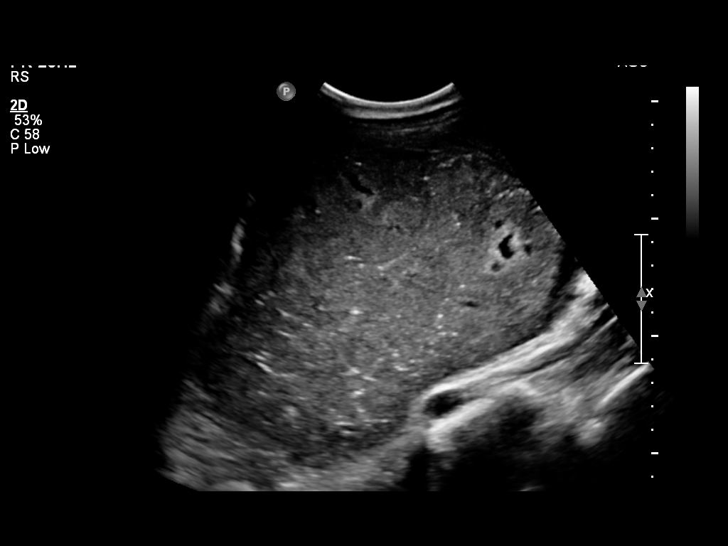
[im 41/51]
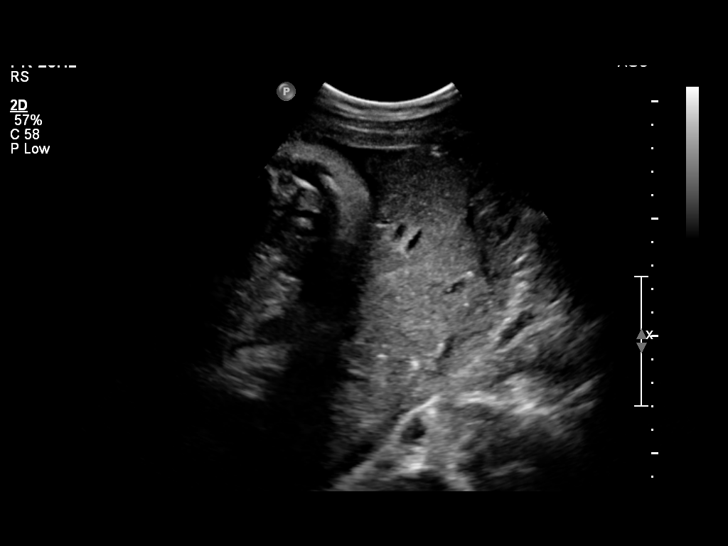
[im 45/51]
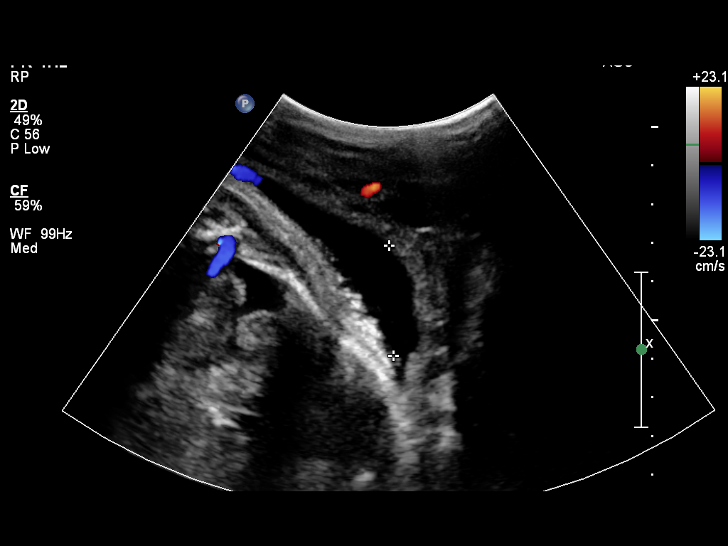
[im 49/51]
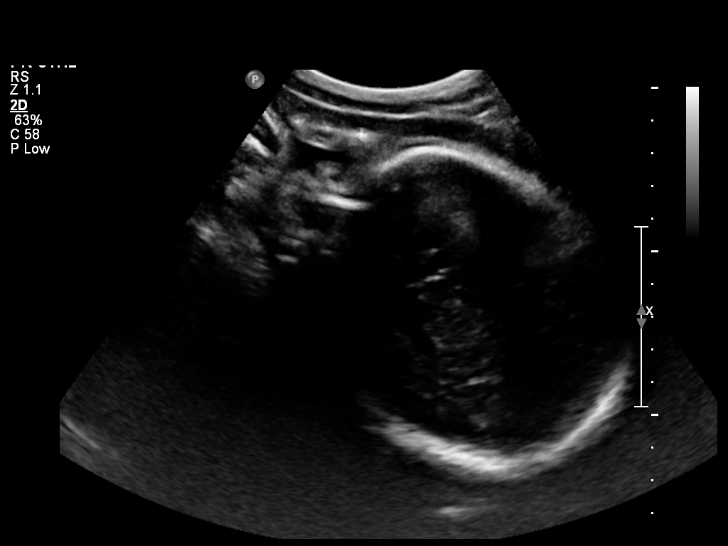

[12 of 28 positions shown; findings below may reference images not displayed]

OBSTETRICS REPORT
                      (Signed Final 09/02/2014 [DATE])

Service(s) Provided

 US OB FOLLOW UP                                       76816.1
Indications

 Gestational diabetes in pregnancy, diet controlled
 36 weeks gestation of pregnancy
Fetal Evaluation

 Num Of Fetuses:    1
 Fetal Heart Rate:  163                          bpm
 Cardiac Activity:  Observed
 Presentation:      Cephalic
 Placenta:          Posterior, above cervical
                    os

 Amniotic Fluid
 AFI FV:      Subjectively within normal limits
 AFI Sum:     10.08   cm       24  %Tile     Larg Pckt:    3.16  cm
 RUQ:   1.04    cm   RLQ:    3.04   cm    LUQ:   3.16    cm   LLQ:    2.84   cm
Biometry

 BPD:     89.6  mm     G. Age:  36w 2d                CI:         76.4   70 - 86
                                                      FL/HC:      21.2   20.8 -

 HC:     324.8  mm     G. Age:  36w 5d       27  %    HC/AC:      1.00   0.92 -

 AC:     324.4  mm     G. Age:  36w 2d       56  %    FL/BPD:     76.8   71 - 87
 FL:      68.8  mm     G. Age:  35w 2d       19  %    FL/AC:      21.2   20 - 24
 HUM:     57.4  mm     G. Age:  33w 2d        8  %

 Est. FW:    4055  gm      6 lb 5 oz     57  %
Gestational Age

 LMP:           37w 5d        Date:  12/11/13                 EDD:   09/17/14
 U/S Today:     36w 1d                                        EDD:   09/28/14
 Best:          36w 4d     Det. By:  Early Ultrasound         EDD:   09/25/14
                                     (02/01/14)
Anatomy

 Cranium:          Appears normal         Aortic Arch:      Previously seen
 Fetal Cavum:      Previously seen        Ductal Arch:      Previously seen
 Ventricles:       Appears normal         Diaphragm:        Previously seen
 Choroid Plexus:   Previously seen        Stomach:          Appears normal, left
                                                            sided
 Cerebellum:       Previously seen        Abdomen:          Previously seen
 Posterior Fossa:  Previously seen        Abdominal Wall:   Previously seen
 Nuchal Fold:      Not applicable (>20    Cord Vessels:     Previously seen
                   wks GA)
 Face:             Orbits and profile     Kidneys:          Appear normal
                   previously seen
 Lips:             Previously seen        Bladder:          Appears normal
 Heart:            Appears normal         Spine:            Previously seen
                   (4CH, axis, and
                   situs)
 RVOT:             Appears normal         Lower             Previously seen
                                          Extremities:
 LVOT:             Appears normal         Upper             Previously seen
                                          Extremities:

 Other:  Male gender. Right heel and 5th digit previously visualized.
         Technically difficult due to fetal position.
Targeted Anatomy

 Fetal Central Nervous System
 Lat. Ventricles:
Cervix Uterus Adnexa

 Cervix:       Not visualized (advanced GA >82wks)
 Uterus:       No abnormality visualized.

 Left Ovary:    Not visualized.
 Right Ovary:   Not visualized.
 Adnexa:     No abnormality visualized. No adnexal mass visualized.
Impression

 Single IUP at 36w 4d
 Normal interval anatomy
 Fetal growth is appropriate (57th %tile)
 Posterior placenta without previa
 Normal amniotic fluid volume
Recommendations

 Recommend interval growth in 4 weeks if patient remains
 undelivered.

 questions or concerns.

## 2015-12-23 ENCOUNTER — Encounter: Payer: Self-pay | Admitting: Obstetrics & Gynecology

## 2015-12-23 ENCOUNTER — Ambulatory Visit (INDEPENDENT_AMBULATORY_CARE_PROVIDER_SITE_OTHER): Payer: BLUE CROSS/BLUE SHIELD

## 2015-12-23 ENCOUNTER — Encounter: Payer: Self-pay | Admitting: Advanced Practice Midwife

## 2015-12-23 ENCOUNTER — Ambulatory Visit: Payer: BLUE CROSS/BLUE SHIELD

## 2015-12-23 ENCOUNTER — Other Ambulatory Visit: Payer: Self-pay | Admitting: Advanced Practice Midwife

## 2015-12-23 ENCOUNTER — Ambulatory Visit (INDEPENDENT_AMBULATORY_CARE_PROVIDER_SITE_OTHER): Payer: BLUE CROSS/BLUE SHIELD | Admitting: Advanced Practice Midwife

## 2015-12-23 VITALS — BP 102/66 | HR 96 | Wt 99.0 lb

## 2015-12-23 DIAGNOSIS — R109 Unspecified abdominal pain: Secondary | ICD-10-CM

## 2015-12-23 DIAGNOSIS — Z3A08 8 weeks gestation of pregnancy: Secondary | ICD-10-CM

## 2015-12-23 DIAGNOSIS — Z8632 Personal history of gestational diabetes: Secondary | ICD-10-CM

## 2015-12-23 DIAGNOSIS — Z36 Encounter for antenatal screening of mother: Secondary | ICD-10-CM | POA: Diagnosis not present

## 2015-12-23 DIAGNOSIS — O3481 Maternal care for other abnormalities of pelvic organs, first trimester: Secondary | ICD-10-CM | POA: Diagnosis not present

## 2015-12-23 DIAGNOSIS — O209 Hemorrhage in early pregnancy, unspecified: Secondary | ICD-10-CM

## 2015-12-23 DIAGNOSIS — Z3481 Encounter for supervision of other normal pregnancy, first trimester: Secondary | ICD-10-CM

## 2015-12-23 DIAGNOSIS — Z3490 Encounter for supervision of normal pregnancy, unspecified, unspecified trimester: Secondary | ICD-10-CM | POA: Insufficient documentation

## 2015-12-23 DIAGNOSIS — O9989 Other specified diseases and conditions complicating pregnancy, childbirth and the puerperium: Secondary | ICD-10-CM

## 2015-12-23 DIAGNOSIS — O26899 Other specified pregnancy related conditions, unspecified trimester: Secondary | ICD-10-CM

## 2015-12-23 DIAGNOSIS — Z3491 Encounter for supervision of normal pregnancy, unspecified, first trimester: Secondary | ICD-10-CM

## 2015-12-23 LAB — POCT BEDSIDE ULTRASOUND
CRL: 1.8 cm
RATE - RATE: 167

## 2015-12-23 LAB — WET PREP, GENITAL
Clue Cells Wet Prep HPF POC: NONE SEEN
TRICH WET PREP: NONE SEEN
YEAST WET PREP: NONE SEEN

## 2015-12-23 MED ORDER — CONCEPT OB 130-92.4-1 MG PO CAPS: 1.0000 | ORAL_CAPSULE | Freq: Every day | ORAL | Status: DC

## 2015-12-23 NOTE — Progress Notes (Signed)
Bedside US shows single IUP with FHR 167 measuring 2557w2d by CRL

## 2015-12-23 NOTE — Patient Instructions (Signed)

## 2015-12-23 NOTE — Progress Notes (Signed)
Subjective:    Phyllis Dwan BoltM [name] is a G2P1001 9239w5d by LMP, US today being seen today for her first obstetrical visit.  Her obstetrical history is significant for A1 GDM. Patient does intend to breast feed. Pregnancy history fully reviewed.  Patient reports Moderate low abdominal cramping for the past week. Denies vaginal bleeding. Has not had any other testing this pregnancy aside from informal bedside ultrasound today. denies GI complaints, urinary complaints, vaginal bleeding or vaginal discharge.  Filed Vitals:   12/23/15 0922  BP: 102/66  Pulse: 96  Weight: 99 lb (44.906 kg)    HISTORY: OB History  Gravida Para Term Preterm AB SAB TAB Ectopic Multiple Living  2 1 1       0 1    # Outcome Date GA Lbr Len/2nd Weight Sex Delivery Anes PTL Lv  2 Current           1 Term 09/22/14 8115w4d 19:48 / 01:13 6 lb 1.5 oz (2.765 kg) M Vag-Spont EPI  Y     Past Medical History  Diagnosis Date  . Gestational diabetes mellitus, antepartum    Past Surgical History  Procedure Laterality Date  . No past surgeries     Family History  Problem Relation Age of Onset  . Multiple sclerosis Mother   . Hypertension Paternal Aunt   . Cancer Maternal Grandmother     breast  . Cancer Maternal Aunt     breast  . Asthma Other   . Diabetes Other      Exam    Uterus:    8-week size   Pelvic Exam:    Perineum: No Hemorrhoids, Normal Perineum   Vulva: Bartholin's, Urethra, Skene's normal   Vagina:  normal mucosa, normal discharge   pH:  N/A    Cervix: multiparous appearance, no bleeding following Pap and no cervical motion tenderness   Adnexa: normal adnexa and no mass, fullness, tenderness   Bony Pelvis: proven to 6 lbs. 1 oz.  System: Breast:  normal appearance, no masses or tenderness, No nipple retraction or dimpling, No nipple discharge or bleeding, No axillary or supraclavicular adenopathy   Skin: normal coloration and turgor, no rashes    Neurologic: oriented, normal mood, grossly  non-focal   Extremities: normal strength, tone, and muscle mass   HEENT sclera clear, anicteric, neck supple with midline trachea and thyroid without masses   Mouth/Teeth mucous membranes moist, pharynx normal without lesions and dental hygiene good   Neck supple and no masses   Cardiovascular: regular rate and rhythm, no murmurs or gallops   Respiratory:  appears well, vitals normal, no respiratory distress, acyanotic, normal RR, chest clear, no wheezing, crepitations, rhonchi, normal symmetric air entry   Abdomen: normal findings: no masses palpable and Soft and abnormal findings:  guarding and Moderate left lower quadrant tenderness to palpation   Urinary: urethral meatus normal      Assessment:    Pregnancy: G2P1001 Patient Active Problem List   Diagnosis Date Noted  . Normal pregnancy 12/23/2015  . History of diet-controlled gestational diabetes mellitus 07/05/2014      1. Normal pregnancy, first trimester  - Culture, OB Urine - POCT bedside ultrasound - GC/Chlamydia Probe Amp - Wet prep, genital - Prenatal Profile - Prenat w/o A Vit-FeFum-FePo-FA (CONCEPT OB) 130-92.4-1 MG CAPS; Take 1 tablet by mouth daily.  Dispense: 30 capsule; Refill: 12 - US Fetal Nuchal Translucency Measurement; Future  2. History of diet-controlled gestational diabetes mellitus - Needs early one-hour GTT and  next visit  3. Abdominal pain affecting pregnancy, antepartum  - US OB Comp Less 14 Wks - US OB Transvaginal    Plan:     Initial labs drawn. Prenatal vitamins. Problem list reviewed and updated. Genetic Screening discussed First Screen: ordered.  Ultrasound discussed; fetal survey: requested.  Follow up in 4 weeks. Sent for formal pelvic ultrasound which showed live single intrauterine pregnancy and 4.1 centimeter right ovarian cyst. Normal left adnexa.   Comfort measures. Abdominal pain in pregnancy precautions.  SINAI, ILLINGWORTH 12/23/2015

## 2015-12-24 LAB — PRENATAL PROFILE (SOLSTAS)
Antibody Screen: NEGATIVE
BASOS PCT: 1 % (ref 0–1)
Basophils Absolute: 0.1 10*3/uL (ref 0.0–0.1)
Eosinophils Absolute: 0.1 10*3/uL (ref 0.0–0.7)
Eosinophils Relative: 1 % (ref 0–5)
HCT: 33 % — ABNORMAL LOW (ref 36.0–46.0)
HEP B S AG: NEGATIVE
HIV: NONREACTIVE
Hemoglobin: 10.7 g/dL — ABNORMAL LOW (ref 12.0–15.0)
LYMPHS PCT: 16 % (ref 12–46)
Lymphs Abs: 1.4 10*3/uL (ref 0.7–4.0)
MCH: 22.9 pg — AB (ref 26.0–34.0)
MCHC: 32.4 g/dL (ref 30.0–36.0)
MCV: 70.7 fL — ABNORMAL LOW (ref 78.0–100.0)
MONO ABS: 0.4 10*3/uL (ref 0.1–1.0)
MPV: 9.5 fL (ref 8.6–12.4)
Monocytes Relative: 5 % (ref 3–12)
Neutro Abs: 6.8 10*3/uL (ref 1.7–7.7)
Neutrophils Relative %: 77 % (ref 43–77)
Platelets: 332 10*3/uL (ref 150–400)
RBC: 4.67 MIL/uL (ref 3.87–5.11)
RDW: 14.9 % (ref 11.5–15.5)
Rh Type: POSITIVE
Rubella: 3.33 Index — ABNORMAL HIGH (ref <0.90)
WBC: 8.8 10*3/uL (ref 4.0–10.5)

## 2015-12-24 LAB — GC/CHLAMYDIA PROBE AMP
CT Probe RNA: NOT DETECTED
CT Probe RNA: NOT DETECTED
GC PROBE AMP APTIMA: NOT DETECTED
GC Probe RNA: NOT DETECTED

## 2015-12-24 LAB — CULTURE, OB URINE
COLONY COUNT: NO GROWTH
Organism ID, Bacteria: NO GROWTH

## 2015-12-29 ENCOUNTER — Encounter: Payer: Self-pay | Admitting: Advanced Practice Midwife

## 2015-12-29 DIAGNOSIS — N83209 Unspecified ovarian cyst, unspecified side: Secondary | ICD-10-CM | POA: Insufficient documentation

## 2015-12-29 DIAGNOSIS — O3480 Maternal care for other abnormalities of pelvic organs, unspecified trimester: Secondary | ICD-10-CM

## 2016-01-20 ENCOUNTER — Ambulatory Visit (INDEPENDENT_AMBULATORY_CARE_PROVIDER_SITE_OTHER): Payer: BLUE CROSS/BLUE SHIELD | Admitting: Family

## 2016-01-20 VITALS — BP 96/58 | HR 86 | Wt 99.0 lb

## 2016-01-20 DIAGNOSIS — Z8632 Personal history of gestational diabetes: Secondary | ICD-10-CM

## 2016-01-20 DIAGNOSIS — Z3491 Encounter for supervision of normal pregnancy, unspecified, first trimester: Secondary | ICD-10-CM

## 2016-01-20 DIAGNOSIS — Z3481 Encounter for supervision of other normal pregnancy, first trimester: Secondary | ICD-10-CM

## 2016-01-20 NOTE — Progress Notes (Signed)
Subjective:  Phyllis [name] is a 30 y.o. G2P1001 at 3057w5d being seen today for ongoing prenatal care.  She is currently monitored for the following issues for this low-risk pregnancy and has History of diet-controlled gestational diabetes mellitus; Normal pregnancy; and Ovarian cyst affecting pregnancy, antepartum on her problem list.  Patient reports fatigue.  Contractions: Not present. Vag. Bleeding: None.  Movement: Absent. Denies leaking of fluid.   The following portions of the patient's history were reviewed and updated as appropriate: allergies, current medications, past family history, past medical history, past social history, past surgical history and problem list. Problem list updated.  Objective:   Filed Vitals:   01/20/16 0908  BP: 96/58  Pulse: 86  Weight: 99 lb (44.906 kg)    Fetal Status: Fetal Heart Rate (bpm): 156 Fundal Height: 13 cm Movement: Absent     General:  Alert, oriented and cooperative. Patient is in no acute distress.  Skin: Skin is warm and dry. No rash noted.   Cardiovascular: Normal heart rate noted  Respiratory: Normal respiratory effort, no problems with respiration noted  Abdomen: Soft, gravid, appropriate for gestational age. Pain/Pressure: Absent     Pelvic: Vag. Bleeding: None Vag D/C Character: Thin   Cervical exam deferred        Extremities: Normal range of motion.  Edema: None  Mental Status: Normal mood and affect. Normal behavior. Normal judgment and thought content.   Urinalysis: Urine Protein: Negative Urine Glucose: Negative  Assessment and Plan:  Pregnancy: G2P1001 at 4257w5d  1. Normal pregnancy, first trimester - AMB referral to maternal fetal medicine - US MFM Fetal Nuchal Translucency; Future  2. History of diet-controlled gestational diabetes mellitus - Pt will return for early one hour next week  3.  Fatigue in Pregnancy - Recommended taking iron consistently   General obstetric precautions including but not limited to  vaginal bleeding and pelvic pain reviewed in detail with the patient. Please refer to After Visit Summary for other counseling recommendations.  Return in about 4 weeks (around 02/17/2016).   Eino FarberWalidah Kennith GainN Karim, CNM

## 2016-02-10 ENCOUNTER — Ambulatory Visit (INDEPENDENT_AMBULATORY_CARE_PROVIDER_SITE_OTHER): Payer: BLUE CROSS/BLUE SHIELD | Admitting: Family

## 2016-02-10 VITALS — BP 92/60 | HR 98 | Wt 104.0 lb

## 2016-02-10 DIAGNOSIS — O9989 Other specified diseases and conditions complicating pregnancy, childbirth and the puerperium: Secondary | ICD-10-CM

## 2016-02-10 DIAGNOSIS — Z3482 Encounter for supervision of other normal pregnancy, second trimester: Secondary | ICD-10-CM

## 2016-02-10 DIAGNOSIS — O3480 Maternal care for other abnormalities of pelvic organs, unspecified trimester: Secondary | ICD-10-CM

## 2016-02-10 DIAGNOSIS — N83209 Unspecified ovarian cyst, unspecified side: Secondary | ICD-10-CM

## 2016-02-10 DIAGNOSIS — R109 Unspecified abdominal pain: Secondary | ICD-10-CM

## 2016-02-10 DIAGNOSIS — Z1389 Encounter for screening for other disorder: Secondary | ICD-10-CM

## 2016-02-10 DIAGNOSIS — Z36 Encounter for antenatal screening of mother: Secondary | ICD-10-CM

## 2016-02-10 DIAGNOSIS — Z3491 Encounter for supervision of normal pregnancy, unspecified, first trimester: Secondary | ICD-10-CM

## 2016-02-10 DIAGNOSIS — Z8632 Personal history of gestational diabetes: Secondary | ICD-10-CM

## 2016-02-10 DIAGNOSIS — O26899 Other specified pregnancy related conditions, unspecified trimester: Secondary | ICD-10-CM

## 2016-02-10 MED ORDER — CONCEPT OB 130-92.4-1 MG PO CAPS: 1.0000 | ORAL_CAPSULE | Freq: Every day | ORAL | Status: DC

## 2016-02-10 NOTE — Progress Notes (Signed)
Subjective:  [place] D[place] BoltM Kretz is a 30 y.o. G2P1001 at 2821w5d being seen today for ongoing prenatal care.  She is currently monitored for the following issues for this low-risk pregnancy and has History of diet-controlled gestational diabetes mellitus; Normal pregnancy; and Ovarian cyst affecting pregnancy, antepartum on her problem list.  Patient reports reports fatigue is improving, taking iron consistently.  Contractions: Not present. Vag. Bleeding: None.  Movement: Absent. Denies leaking of fluid.   The following portions of the patient's history were reviewed and updated as appropriate: allergies, current medications, past family history, past medical history, past social history, past surgical history and problem list. Problem list updated.  Objective:   Filed Vitals:   02/10/16 0942  BP: 92/60  Pulse: 98  Weight: 104 lb (47.174 kg)    Fetal Status: Fetal Heart Rate (bpm): 155 Fundal Height: 16 cm Movement: Absent     General:  Alert, oriented and cooperative. Patient is in no acute distress.  Skin: Skin is warm and dry. No rash noted.   Cardiovascular: Normal heart rate noted  Respiratory: Normal respiratory effort, no problems with respiration noted  Abdomen: Soft, gravid, appropriate for gestational age. Pain/Pressure: Absent     Pelvic: Vag. Bleeding: None Vag D/C Character: Thin   Cervical exam deferred        Extremities: Normal range of motion.  Edema: None  Mental Status: Normal mood and affect. Normal behavior. Normal judgment and thought content.   Urinalysis: Urine Protein: Negative Urine Glucose: Negative  Assessment and Plan:  Pregnancy: G2P1001 at 6321w5d  1. Normal pregnancy, Second trimester - Prenat w/o A Vit-FeFum-FePo-FA (CONCEPT OB) 130-92.4-1 MG CAPS; Take 1 tablet by mouth daily.  Dispense: 30 capsule; Refill: 12 - US MFM OB COMP + 14 WK; Future  2. History of diet-controlled gestational diabetes mellitus - Prenat w/o A Vit-FeFum-FePo-FA (CONCEPT OB)  130-92.4-1 MG CAPS; Take 1 tablet by mouth daily.  Dispense: 30 capsule; Refill: 12 - Declines taking 1 hr  3.  Encounter for routine screening for malformation using ultrasonics - US MFM OB COMP + 14 WK; Future  4. Ovarian cyst affecting pregnancy, antepartum - US MFM OB COMP + 14 WK; Future  Preterm labor symptoms and general obstetric precautions including but not limited to vaginal bleeding, contractions, leaking of fluid and fetal movement were reviewed in detail with the patient. Please refer to After Visit Summary for other counseling recommendations.  Return in about 4 weeks (around 03/09/2016).   Eino FarberWalidah Kennith GainN Karim, CNM

## 2016-02-17 ENCOUNTER — Encounter: Payer: BLUE CROSS/BLUE SHIELD | Admitting: Family

## 2016-03-02 ENCOUNTER — Ambulatory Visit (HOSPITAL_COMMUNITY)
Admission: RE | Admit: 2016-03-02 | Discharge: 2016-03-02 | Disposition: A | Discharge status: Home / Self Care | Payer: BLUE CROSS/BLUE SHIELD | Source: Ambulatory Visit | Attending: Family | Admitting: Family

## 2016-03-02 ENCOUNTER — Other Ambulatory Visit: Payer: Self-pay | Admitting: Family

## 2016-03-02 DIAGNOSIS — Z3491 Encounter for supervision of normal pregnancy, unspecified, first trimester: Secondary | ICD-10-CM | POA: Diagnosis not present

## 2016-03-02 DIAGNOSIS — Z1389 Encounter for screening for other disorder: Secondary | ICD-10-CM

## 2016-03-02 DIAGNOSIS — Z3A18 18 weeks gestation of pregnancy: Secondary | ICD-10-CM | POA: Diagnosis not present

## 2016-03-02 DIAGNOSIS — N83209 Unspecified ovarian cyst, unspecified side: Secondary | ICD-10-CM

## 2016-03-02 DIAGNOSIS — O3480 Maternal care for other abnormalities of pelvic organs, unspecified trimester: Secondary | ICD-10-CM

## 2016-03-02 DIAGNOSIS — Z3689 Encounter for other specified antenatal screening: Secondary | ICD-10-CM

## 2016-03-02 DIAGNOSIS — O09292 Supervision of pregnancy with other poor reproductive or obstetric history, second trimester: Secondary | ICD-10-CM

## 2016-03-02 DIAGNOSIS — Z8632 Personal history of gestational diabetes: Secondary | ICD-10-CM

## 2016-03-09 ENCOUNTER — Encounter: Payer: BLUE CROSS/BLUE SHIELD | Admitting: Certified Nurse Midwife

## 2016-03-12 ENCOUNTER — Telehealth: Payer: Self-pay | Admitting: Family

## 2016-03-12 DIAGNOSIS — Z3492 Encounter for supervision of normal pregnancy, unspecified, second trimester: Secondary | ICD-10-CM

## 2016-03-12 NOTE — Telephone Encounter (Signed)
No answer/unable to leave voicemail

## 2016-03-13 ENCOUNTER — Ambulatory Visit (INDEPENDENT_AMBULATORY_CARE_PROVIDER_SITE_OTHER): Payer: BLUE CROSS/BLUE SHIELD | Admitting: Obstetrics & Gynecology

## 2016-03-13 VITALS — BP 94/60 | HR 83 | Wt 108.0 lb

## 2016-03-13 DIAGNOSIS — Z36 Encounter for antenatal screening of mother: Secondary | ICD-10-CM

## 2016-03-13 DIAGNOSIS — Z8632 Personal history of gestational diabetes: Secondary | ICD-10-CM

## 2016-03-13 DIAGNOSIS — Z3492 Encounter for supervision of normal pregnancy, unspecified, second trimester: Secondary | ICD-10-CM

## 2016-03-13 DIAGNOSIS — Z3482 Encounter for supervision of other normal pregnancy, second trimester: Secondary | ICD-10-CM

## 2016-03-13 NOTE — Progress Notes (Signed)
Do not tell sex of baby

## 2016-03-13 NOTE — Progress Notes (Signed)
Subjective:  Phyllis [name] is a 30 y.o. G2P1001 (son at home) at 6857w2d being seen today for ongoing prenatal care.  She is currently monitored for the following issues for this low-risk pregnancy and has History of diet-controlled gestational diabetes mellitus; Normal pregnancy; and Ovarian cyst affecting pregnancy, antepartum on her problem list.  Patient reports no complaints.  Contractions: Not present. Vag. Bleeding: None.  Movement: Present. Denies leaking of fluid.   The following portions of the patient's history were reviewed and updated as appropriate: allergies, current medications, past family history, past medical history, past social history, past surgical history and problem list. Problem list updated.  Objective:   Filed Vitals:   03/13/16 1332  BP: 94/60  Pulse: 83  Weight: 48.988 kg (108 lb)    Fetal Status: Fetal Heart Rate (bpm): 159   Movement: Present     General:  Alert, oriented and cooperative. Patient is in no acute distress.  Skin: Skin is warm and dry. No rash noted.   Cardiovascular: Normal heart rate noted  Respiratory: Normal respiratory effort, no problems with respiration noted  Abdomen: Soft, gravid, appropriate for gestational age. Pain/Pressure: Absent     Pelvic: Cervical exam deferred        Extremities: Normal range of motion.  Edema: None  Mental Status: Normal mood and affect. Normal behavior. Normal judgment and thought content.   Urinalysis: Urine Protein: Negative Urine Glucose: Negative  Assessment and Plan:  Pregnancy: G2P1001 at 5257w2d  1. History of diet-controlled gestational diabetes mellitus  - Glucose Tolerance, 1 HR (50g)  2. Normal pregnancy, second trimester - Order repeat u/s at next visit  Preterm labor symptoms and general obstetric precautions including but not limited to vaginal bleeding, contractions, leaking of fluid and fetal movement were reviewed in detail with the patient. Please refer to After Visit Summary for  other counseling recommendations.  Return in about 4 weeks (around 04/10/2016).   Allie BossierMyra C Rachele Lamaster, MD

## 2016-03-14 ENCOUNTER — Telehealth: Payer: Self-pay | Admitting: *Deleted

## 2016-03-14 DIAGNOSIS — Z3492 Encounter for supervision of normal pregnancy, unspecified, second trimester: Secondary | ICD-10-CM

## 2016-03-14 LAB — GLUCOSE TOLERANCE, 1 HOUR (50G) W/O FASTING: Glucose, 1 Hr, gestational: 130 mg/dL

## 2016-03-14 NOTE — Telephone Encounter (Signed)
LM on voicemail of normal 1 hr GTT. 

## 2016-03-27 ENCOUNTER — Ambulatory Visit (INDEPENDENT_AMBULATORY_CARE_PROVIDER_SITE_OTHER): Payer: BLUE CROSS/BLUE SHIELD | Admitting: Obstetrics & Gynecology

## 2016-03-27 VITALS — BP 103/60 | HR 101 | Wt 111.0 lb

## 2016-03-27 DIAGNOSIS — Z3482 Encounter for supervision of other normal pregnancy, second trimester: Secondary | ICD-10-CM

## 2016-03-27 DIAGNOSIS — Z36 Encounter for antenatal screening of mother: Secondary | ICD-10-CM | POA: Diagnosis not present

## 2016-03-27 DIAGNOSIS — Z3492 Encounter for supervision of normal pregnancy, unspecified, second trimester: Secondary | ICD-10-CM

## 2016-03-27 NOTE — Progress Notes (Signed)
Subjective:  Phyllis [name] is a 30 y.o. G2P1001 at 568w2d being seen today for ongoing prenatal care.  She is currently monitored for the following issues for this low-risk pregnancy and has History of diet-controlled gestational diabetes mellitus; Normal pregnancy; and Ovarian cyst affecting pregnancy, antepartum on her problem list.  Patient reports no complaints.  Contractions: Not present. Vag. Bleeding: None.  Movement: Present. Denies leaking of fluid.   The following portions of the patient's history were reviewed and updated as appropriate: allergies, current medications, past family history, past medical history, past social history, past surgical history and problem list. Problem list updated.  Objective:   Filed Vitals:   03/27/16 1422  BP: 103/60  Pulse: 101  Weight: 111 lb (50.349 kg)    Fetal Status: Fetal Heart Rate (bpm): 147   Movement: Present     General:  Alert, oriented and cooperative. Patient is in no acute distress.  Skin: Skin is warm and dry. No rash noted.   Cardiovascular: Normal heart rate noted  Respiratory: Normal respiratory effort, no problems with respiration noted  Abdomen: Soft, gravid, appropriate for gestational age. Pain/Pressure: Present     Pelvic: Cervical exam deferred        Extremities: Normal range of motion.  Edema: None  Mental Status: Normal mood and affect. Normal behavior. Normal judgment and thought content.   Urinalysis: Urine Protein: Negative Urine Glucose: 1+  Assessment and Plan:  Pregnancy: G2P1001 at 5468w2d  1. Supervision of low-risk pregnancy, second trimester Placental cyst, EFLV, and cyst next to stomach--will get f/u US.  Pt elects for quad screen now. - AFP, Quad Screen - US MFM OB FOLLOW UP; Future  Preterm labor symptoms and general obstetric precautions including but not limited to vaginal bleeding, contractions, leaking of fluid and fetal movement were reviewed in detail with the patient. Please refer to After  Visit Summary for other counseling recommendations.  Return in about 4 weeks (around 04/24/2016).   Lesly DukesKelly H Jalesia Loudenslager, MD

## 2016-03-30 LAB — AFP, QUAD SCREEN
AFP: 78.1 ng/mL
Curr Gest Age: 22.3 weeks
HCG TOTAL: 36.15 [IU]/mL
INH: 176.6 pg/mL
Interpretation-AFP: NEGATIVE
MoM for AFP: 0.78
MoM for INH: 0.75
MoM for hCG: 1.59
OPEN SPINA BIFIDA: NEGATIVE
Tri 18 Scr Risk Est: NEGATIVE
Trisomy 18 (Edward) Syndrome Interp.: 1:54100 {titer}
UE3 MOM: 0.88
UE3 VALUE: 2.37 ng/mL

## 2016-04-04 ENCOUNTER — Telehealth: Payer: Self-pay | Admitting: *Deleted

## 2016-04-04 NOTE — Telephone Encounter (Signed)
LM on voicemail of normal AFP results.

## 2016-04-12 ENCOUNTER — Ambulatory Visit (HOSPITAL_COMMUNITY)
Admission: RE | Admit: 2016-04-12 | Discharge: 2016-04-12 | Disposition: A | Discharge status: Home / Self Care | Payer: BLUE CROSS/BLUE SHIELD | Source: Ambulatory Visit | Attending: Obstetrics & Gynecology | Admitting: Obstetrics & Gynecology

## 2016-04-12 ENCOUNTER — Other Ambulatory Visit: Payer: Self-pay | Admitting: Obstetrics & Gynecology

## 2016-04-12 DIAGNOSIS — Z3A24 24 weeks gestation of pregnancy: Secondary | ICD-10-CM | POA: Insufficient documentation

## 2016-04-12 DIAGNOSIS — O359XX0 Maternal care for (suspected) fetal abnormality and damage, unspecified, not applicable or unspecified: Secondary | ICD-10-CM | POA: Insufficient documentation

## 2016-04-12 DIAGNOSIS — O43192 Other malformation of placenta, second trimester: Secondary | ICD-10-CM | POA: Insufficient documentation

## 2016-04-12 DIAGNOSIS — O09292 Supervision of pregnancy with other poor reproductive or obstetric history, second trimester: Secondary | ICD-10-CM | POA: Insufficient documentation

## 2016-04-12 DIAGNOSIS — Z3492 Encounter for supervision of normal pregnancy, unspecified, second trimester: Secondary | ICD-10-CM

## 2016-04-24 ENCOUNTER — Ambulatory Visit (INDEPENDENT_AMBULATORY_CARE_PROVIDER_SITE_OTHER): Payer: BLUE CROSS/BLUE SHIELD | Admitting: Obstetrics & Gynecology

## 2016-04-24 VITALS — BP 95/81 | HR 110 | Wt 116.0 lb

## 2016-04-24 DIAGNOSIS — Z3482 Encounter for supervision of other normal pregnancy, second trimester: Secondary | ICD-10-CM

## 2016-04-24 DIAGNOSIS — Z8632 Personal history of gestational diabetes: Secondary | ICD-10-CM

## 2016-04-24 DIAGNOSIS — Z3492 Encounter for supervision of normal pregnancy, unspecified, second trimester: Secondary | ICD-10-CM

## 2016-04-24 NOTE — Progress Notes (Signed)
Subjective:  Phyllis [name] is a 30 y.o. G2P1001 at [redacted]w[redacted]d being seen today for ongoing prenatal care.  She is currently monitored for the following issues for this low-risk pregnancy and has History of diet-controlled gestational diabetes mellitus; Normal pregnancy; and Ovarian cyst affecting pregnancy, antepartum on her problem list.  Patient reports pelvic pressure for 3 days..  Contractions: Not present. Vag. Bleeding: None.  Movement: Present. Denies leaking of fluid.   The following portions of the patient's history were reviewed and updated as appropriate: allergies, current medications, past family history, past medical history, past social history, past surgical history and problem list. Problem list updated.  Objective:   Vitals:   04/24/16 1525  BP: 95/81  Pulse: (!) 110  Weight: 116 lb (52.6 kg)    Fetal Status: Fetal Heart Rate (bpm): 143   Movement: Present     General:  Alert, oriented and cooperative. Patient is in no acute distress.  Skin: Skin is warm and dry. No rash noted.   Cardiovascular: Normal heart rate noted  Respiratory: Normal respiratory effort, no problems with respiration noted  Abdomen: Soft, gravid, appropriate for gestational age. Pain/Pressure: Present     Pelvic:  Cervical exam performed        Extremities: Normal range of motion.  Edema: None  Mental Status: Normal mood and affect. Normal behavior. Normal judgment and thought content.   Urinalysis: Urine Protein: Negative Urine Glucose: 1+  Assessment and Plan:  Pregnancy: G2P1001 at [redacted]w[redacted]d  1. History of diet-controlled gestational diabetes mellitus - normal glucola  2. Normal pregnancy, second trimester   Preterm labor symptoms and general obstetric precautions including but not limited to vaginal bleeding, contractions, leaking of fluid and fetal movement were reviewed in detail with the patient. Please refer to After Visit Summary for other counseling recommendations.  No Follow-up on  file.   Allie Bossier, MD

## 2016-04-24 NOTE — Progress Notes (Signed)
C/o lower back pain.  She would like a pelvic exam today

## 2016-05-16 ENCOUNTER — Ambulatory Visit (INDEPENDENT_AMBULATORY_CARE_PROVIDER_SITE_OTHER): Payer: BLUE CROSS/BLUE SHIELD | Admitting: Obstetrics & Gynecology

## 2016-05-16 VITALS — BP 95/61 | HR 102 | Wt 116.0 lb

## 2016-05-16 DIAGNOSIS — Z23 Encounter for immunization: Secondary | ICD-10-CM | POA: Diagnosis not present

## 2016-05-16 DIAGNOSIS — Z3493 Encounter for supervision of normal pregnancy, unspecified, third trimester: Secondary | ICD-10-CM

## 2016-05-16 NOTE — Progress Notes (Signed)
Subjective:  Phyllis [name] is a 30 y.o. G2P1001 at 4852w3d being seen today for ongoing prenatal care.  She is currently monitored for the following issues for this low-risk pregnancy and has History of diet-controlled gestational diabetes mellitus; Normal pregnancy; and Ovarian cyst affecting pregnancy, antepartum on her problem list.  Patient reports no complaints.  Contractions: Irregular. Vag. Bleeding: None.  Movement: Present. Denies leaking of fluid.   The following portions of the patient's history were reviewed and updated as appropriate: allergies, current medications, past family history, past medical history, past social history, past surgical history and problem list. Problem list updated.  Objective:   Vitals:   05/16/16 1005  BP: 95/61  Pulse: (!) 102  Weight: 116 lb (52.6 kg)    Fetal Status: Fetal Heart Rate (bpm): 147   Movement: Present     General:  Alert, oriented and cooperative. Patient is in no acute distress.  Skin: Skin is warm and dry. No rash noted.   Cardiovascular: Normal heart rate noted  Respiratory: Normal respiratory effort, no problems with respiration noted  Abdomen: Soft, gravid, appropriate for gestational age. Pain/Pressure: Present     Pelvic:  Cervical exam deferred        Extremities: Normal range of motion.  Edema: None  Mental Status: Normal mood and affect. Normal behavior. Normal judgment and thought content.   Urinalysis:      Assessment and Plan:  Pregnancy: G2P1001 at 5452w3d  1. Normal pregnancy, third trimester - TDAP today. She will RTC tomorrow for her 28 week labs as she can't stay this morning for the glucola.  Preterm labor symptoms and general obstetric precautions including but not limited to vaginal bleeding, contractions, leaking of fluid and fetal movement were reviewed in detail with the patient. Please refer to After Visit Summary for other counseling recommendations.  No Follow-up on file.   Allie BossierMyra C Tabbatha Bordelon, MD

## 2016-05-17 LAB — CBC
HEMATOCRIT: 28.6 % — AB (ref 35.0–45.0)
HEMOGLOBIN: 9.1 g/dL — AB (ref 11.7–15.5)
MCH: 23.4 pg — ABNORMAL LOW (ref 27.0–33.0)
MCHC: 31.8 g/dL — ABNORMAL LOW (ref 32.0–36.0)
MCV: 73.5 fL — AB (ref 80.0–100.0)
MPV: 10.2 fL (ref 7.5–12.5)
Platelets: 238 10*3/uL (ref 140–400)
RBC: 3.89 MIL/uL (ref 3.80–5.10)
RDW: 16.1 % — ABNORMAL HIGH (ref 11.0–15.0)
WBC: 8.4 10*3/uL (ref 3.8–10.8)

## 2016-05-18 LAB — RPR

## 2016-05-18 LAB — GLUCOSE, RANDOM: Glucose, Bld: 134 mg/dL — ABNORMAL HIGH (ref 65–99)

## 2016-05-18 LAB — HIV ANTIBODY (ROUTINE TESTING W REFLEX): HIV: NONREACTIVE

## 2016-05-21 LAB — GLUCOSE TOLERANCE, 1 HOUR

## 2016-05-31 ENCOUNTER — Ambulatory Visit (INDEPENDENT_AMBULATORY_CARE_PROVIDER_SITE_OTHER): Payer: BLUE CROSS/BLUE SHIELD | Admitting: Obstetrics & Gynecology

## 2016-05-31 VITALS — BP 98/57 | HR 98 | Wt 118.0 lb

## 2016-05-31 DIAGNOSIS — Z8632 Personal history of gestational diabetes: Secondary | ICD-10-CM

## 2016-05-31 DIAGNOSIS — O3480 Maternal care for other abnormalities of pelvic organs, unspecified trimester: Secondary | ICD-10-CM

## 2016-05-31 DIAGNOSIS — N83209 Unspecified ovarian cyst, unspecified side: Secondary | ICD-10-CM

## 2016-05-31 DIAGNOSIS — Z3493 Encounter for supervision of normal pregnancy, unspecified, third trimester: Secondary | ICD-10-CM

## 2016-05-31 DIAGNOSIS — Z3483 Encounter for supervision of other normal pregnancy, third trimester: Secondary | ICD-10-CM

## 2016-05-31 NOTE — Progress Notes (Signed)
   PRENATAL VISIT NOTE  Subjective:  Phyllis [name] is a 30 y.o. G2P1001 at 5515w4d being seen today for ongoing prenatal care.  She is currently monitored for the following issues for this low-risk pregnancy and has History of diet-controlled gestational diabetes mellitus; Normal pregnancy; and Ovarian cyst affecting pregnancy, antepartum on her problem list.  Patient reports fatigue.  Contractions: Irregular. Vag. Bleeding: None.  Movement: Present. Denies leaking of fluid.   The following portions of the patient's history were reviewed and updated as appropriate: allergies, current medications, past family history, past medical history, past social history, past surgical history and problem list. Problem list updated.  Objective:   Vitals:   05/31/16 1351  BP: (!) 98/57  Pulse: 98  Weight: 118 lb (53.5 kg)    Fetal Status: Fetal Heart Rate (bpm): 151   Movement: Present     General:  Alert, oriented and cooperative. Patient is in no acute distress.  Skin: Skin is warm and dry. No rash noted.   Cardiovascular: Normal heart rate noted  Respiratory: Normal respiratory effort, no problems with respiration noted  Abdomen: Soft, gravid, appropriate for gestational age. Pain/Pressure: Present     Pelvic:  Cervical exam deferred        Extremities: Normal range of motion.  Edema: None  Mental Status: Normal mood and affect. Normal behavior. Normal judgment and thought content.   Urinalysis: Urine Protein: Negative Urine Glucose: 1+  Assessment and Plan:  Pregnancy: G2P1001 at 5015w4d  1. Normal pregnancy, third trimester  2. History of diet-controlled gestational diabetes mellitus Prev h/o GDM. 1 hour is 134. Rec diet control and exercise.     3. Ovarian cyst affecting pregnancy, antepartum  Preterm labor symptoms and general obstetric precautions including but not limited to vaginal bleeding, contractions, leaking of fluid and fetal movement were reviewed in detail with the  patient. Please refer to After Visit Summary for other counseling recommendations.  Return in about 2 weeks (around 06/14/2016).  Willodean Rosenthalarolyn Harraway-Sterry, MD

## 2016-05-31 NOTE — Patient Instructions (Signed)

## 2016-06-14 ENCOUNTER — Ambulatory Visit (INDEPENDENT_AMBULATORY_CARE_PROVIDER_SITE_OTHER): Payer: BLUE CROSS/BLUE SHIELD | Admitting: Obstetrics and Gynecology

## 2016-06-14 VITALS — BP 96/63 | HR 99 | Wt 118.0 lb

## 2016-06-14 DIAGNOSIS — Z3493 Encounter for supervision of normal pregnancy, unspecified, third trimester: Secondary | ICD-10-CM

## 2016-06-14 DIAGNOSIS — Z23 Encounter for immunization: Secondary | ICD-10-CM

## 2016-06-14 NOTE — Addendum Note (Signed)
Addended by: Granville LewisLARK, Cj Beecher L on: 06/14/2016 02:57 PM   Modules accepted: Orders

## 2016-06-14 NOTE — Progress Notes (Signed)
   PRENATAL VISIT NOTE  Subjective:  [place] Dwan BoltM Hobby is a 30 y.o. G2P1001 at 4560w4d being seen today for ongoing prenatal care.  She is currently monitored for the following issues for this low-risk pregnancy and has History of diet-controlled gestational diabetes mellitus; Normal pregnancy; and Ovarian cyst affecting pregnancy, antepartum on her problem list.  Patient reports no complaints.  Contractions: Irritability. Vag. Bleeding: None.  Movement: Present. Denies leaking of fluid.   The following portions of the patient's history were reviewed and updated as appropriate: allergies, current medications, past family history, past medical history, past social history, past surgical history and problem list. Problem list updated.  Objective:   Vitals:   06/14/16 1435  BP: 96/63  Pulse: 99  Weight: 118 lb (53.5 kg)    Fetal Status: Fetal Heart Rate (bpm): 136 Fundal Height: 33 cm Movement: Present     General:  Alert, oriented and cooperative. Patient is in no acute distress.  Skin: Skin is warm and dry. No rash noted.   Cardiovascular: Normal heart rate noted  Respiratory: Normal respiratory effort, no problems with respiration noted  Abdomen: Soft, gravid, appropriate for gestational age. Pain/Pressure: Present     Pelvic:  Cervical exam deferred        Extremities: Normal range of motion.  Edema: None  Mental Status: Normal mood and affect. Normal behavior. Normal judgment and thought content.   Urinalysis: Urine Protein: Negative Urine Glucose: Trace  Assessment and Plan:  Pregnancy: G2P1001 at 6760w4d  1. Normal pregnancy, third trimester Patient is doing well without complaints States she reduced her work hours to help with braxton hicks She is adhering to diabetic diet Flu shot today  Preterm labor symptoms and general obstetric precautions including but not limited to vaginal bleeding, contractions, leaking of fluid and fetal movement were reviewed in detail with the  patient. Please refer to After Visit Summary for other counseling recommendations.  Return in about 2 weeks (around 06/28/2016).  Catalina AntiguaPeggy Emilly Lavey, MD

## 2016-06-28 ENCOUNTER — Encounter: Payer: Self-pay | Admitting: *Deleted

## 2016-06-28 ENCOUNTER — Ambulatory Visit (INDEPENDENT_AMBULATORY_CARE_PROVIDER_SITE_OTHER): Payer: BLUE CROSS/BLUE SHIELD | Admitting: Obstetrics & Gynecology

## 2016-06-28 VITALS — BP 92/57 | Wt 120.0 lb

## 2016-06-28 DIAGNOSIS — N83209 Unspecified ovarian cyst, unspecified side: Secondary | ICD-10-CM

## 2016-06-28 DIAGNOSIS — O3480 Maternal care for other abnormalities of pelvic organs, unspecified trimester: Principal | ICD-10-CM

## 2016-06-28 DIAGNOSIS — Z8632 Personal history of gestational diabetes: Secondary | ICD-10-CM

## 2016-06-28 DIAGNOSIS — Z3493 Encounter for supervision of normal pregnancy, unspecified, third trimester: Secondary | ICD-10-CM

## 2016-06-28 DIAGNOSIS — O3483 Maternal care for other abnormalities of pelvic organs, third trimester: Secondary | ICD-10-CM

## 2016-06-28 NOTE — Progress Notes (Signed)
   PRENATAL VISIT NOTE  Subjective:  Phyllis [name] is a 30 y.o. G2P1001 at 5775w4d being seen today for ongoing prenatal care.  She is currently monitored for the following issues for this high-risk pregnancy and has History of diet-controlled gestational diabetes mellitus; Normal pregnancy; and Ovarian cyst affecting pregnancy, antepartum on her problem list.  Patient reports pain from baby kicking in upper abd.  Contractions: Not present. Vag. Bleeding: None.  Movement: Present. Denies leaking of fluid.   The following portions of the patient's history were reviewed and updated as appropriate: allergies, current medications, past family history, past medical history, past social history, past surgical history and problem list. Problem list updated.  Objective:   Vitals:   06/28/16 1542  BP: (!) 92/57  Weight: 120 lb (54.4 kg)    Fetal Status: Fetal Heart Rate (bpm): 134   Movement: Present     General:  Alert, oriented and cooperative. Patient is in no acute distress.  Skin: Skin is warm and dry. No rash noted.   Cardiovascular: Normal heart rate noted  Respiratory: Normal respiratory effort, no problems with respiration noted  Abdomen: Soft, gravid, appropriate for gestational age. Pain/Pressure: Absent     Pelvic:  Cervical exam deferred        Extremities: Normal range of motion.  Edema: None  Mental Status: Normal mood and affect. Normal behavior. Normal judgment and thought content.   Urinalysis: Urine Protein: Negative Urine Glucose: Negative  Assessment and Plan:  Pregnancy: G2P1001 at 8075w4d  1. Ovarian cyst affecting pregnancy, antepartum stable  2. Normal pregnancy, third trimester  3. History of diet-controlled gestational diabetes mellitus FSG in am fasting all <80. Pt has not been checking at other times.  Pt encouraged to randomly check her glc throughout the day  Preterm labor symptoms and general obstetric precautions including but not limited to vaginal  bleeding, contractions, leaking of fluid and fetal movement were reviewed in detail with the patient. Please refer to After Visit Summary for other counseling recommendations.  Return in about 1 week (around 07/05/2016).  [name], MD

## 2016-07-05 ENCOUNTER — Ambulatory Visit (INDEPENDENT_AMBULATORY_CARE_PROVIDER_SITE_OTHER): Payer: BLUE CROSS/BLUE SHIELD | Admitting: Obstetrics & Gynecology

## 2016-07-05 VITALS — BP 96/56 | HR 97 | Wt 121.0 lb

## 2016-07-05 DIAGNOSIS — Z3A36 36 weeks gestation of pregnancy: Secondary | ICD-10-CM

## 2016-07-05 DIAGNOSIS — Z113 Encounter for screening for infections with a predominantly sexual mode of transmission: Secondary | ICD-10-CM | POA: Diagnosis not present

## 2016-07-05 DIAGNOSIS — Z3483 Encounter for supervision of other normal pregnancy, third trimester: Secondary | ICD-10-CM

## 2016-07-05 LAB — OB RESULTS CONSOLE GC/CHLAMYDIA
CHLAMYDIA, DNA PROBE: NEGATIVE
GC PROBE AMP, GENITAL: NEGATIVE

## 2016-07-05 LAB — OB RESULTS CONSOLE GBS: STREP GROUP B AG: NEGATIVE

## 2016-07-05 NOTE — Progress Notes (Signed)
Increase in glucose from urine dipstick

## 2016-07-05 NOTE — Progress Notes (Signed)
   PRENATAL VISIT NOTE  Subjective:  Phyllis [name] is a 30 y.o. G2P1001 at 3014w4d being seen today for ongoing prenatal care.  She is currently monitored for the following issues for this low-risk pregnancy and has History of diet-controlled gestational diabetes mellitus; Normal pregnancy; and Ovarian cyst affecting pregnancy, antepartum on her problem list.  Patient reports no complaints.  Contractions: Irregular. Vag. Bleeding: None.  Movement: Present. Denies leaking of fluid.   The following portions of the patient's history were reviewed and updated as appropriate: allergies, current medications, past family history, past medical history, past social history, past surgical history and problem list. Problem list updated.  Objective:   Vitals:   07/05/16 1326  BP: (!) 96/56  Pulse: 97  Weight: 121 lb (54.9 kg)    Fetal Status: Fetal Heart Rate (bpm): 136   Movement: Present     General:  Alert, oriented and cooperative. Patient is in no acute distress.  Skin: Skin is warm and dry. No rash noted.   Cardiovascular: Normal heart rate noted  Respiratory: Normal respiratory effort, no problems with respiration noted  Abdomen: Soft, gravid, appropriate for gestational age. Pain/Pressure: Present     Pelvic:  Cervical exam performed        Extremities: Normal range of motion.  Edema: None  Mental Status: Normal mood and affect. Normal behavior. Normal judgment and thought content.   Urinalysis: Urine Protein: Negative Urine Glucose: 3+  Assessment and Plan:  Pregnancy: G2P1001 at 7714w4d  1. [redacted] weeks gestation of pregnancy - Culture, beta strep (group b only) - GC/Chlamydia Probe Amp  Term labor symptoms and general obstetric precautions including but not limited to vaginal bleeding, contractions, leaking of fluid and fetal movement were reviewed in detail with the patient. Please refer to After Visit Summary for other counseling recommendations.  Return in about 1 week (around  07/12/2016).  Lesly DukesKelly H Walsie Smeltz, MD

## 2016-07-06 LAB — GC/CHLAMYDIA PROBE AMP
CT PROBE, AMP APTIMA: NOT DETECTED
GC PROBE AMP APTIMA: NOT DETECTED

## 2016-07-07 LAB — CULTURE, BETA STREP (GROUP B ONLY)

## 2016-07-12 ENCOUNTER — Encounter: Payer: Self-pay | Admitting: Obstetrics & Gynecology

## 2016-07-12 ENCOUNTER — Ambulatory Visit (INDEPENDENT_AMBULATORY_CARE_PROVIDER_SITE_OTHER): Payer: BLUE CROSS/BLUE SHIELD | Admitting: Obstetrics & Gynecology

## 2016-07-12 VITALS — BP 92/59 | HR 89 | Wt 117.0 lb

## 2016-07-12 DIAGNOSIS — Z3493 Encounter for supervision of normal pregnancy, unspecified, third trimester: Secondary | ICD-10-CM

## 2016-07-12 NOTE — Patient Instructions (Signed)
Return to clinic for any scheduled appointments or obstetric concerns, or go to MAU for evaluation  

## 2016-07-12 NOTE — Progress Notes (Signed)
   PRENATAL VISIT NOTE  Subjective:  Phyllis [name] is a 30 y.o. G2P1001 at 3572w4d being seen today for ongoing prenatal care.  She is currently monitored for the following issues for this low-risk pregnancy and has Normal pregnancy and Ovarian cyst affecting pregnancy, antepartum on her problem list.  Patient reports occasional contractions.  Contractions: Irregular. Vag. Bleeding: None.  Movement: Present. Denies leaking of fluid.   The following portions of the patient's history were reviewed and updated as appropriate: allergies, current medications, past family history, past medical history, past social history, past surgical history and problem list. Problem list updated.  Objective:   Vitals:   07/12/16 1500  BP: (!) 92/59  Pulse: 89  Weight: 117 lb (53.1 kg)    Fetal Status: Fetal Heart Rate (bpm): 136 Fundal Height: 37 cm Movement: Present  Presentation: Vertex  General:  Alert, oriented and cooperative. Patient is in no acute distress.  Skin: Skin is warm and dry. No rash noted.   Cardiovascular: Normal heart rate noted  Respiratory: Normal respiratory effort, no problems with respiration noted  Abdomen: Soft, gravid, appropriate for gestational age. Pain/Pressure: Present     Pelvic:  Cervical exam performed Dilation: 1 Effacement (%): Thick Station: -3  Extremities: Normal range of motion.  Edema: None  Mental Status: Normal mood and affect. Normal behavior. Normal judgment and thought content.   Urinalysis: Urine Protein: Trace Urine Glucose: Negative  Assessment and Plan:  Pregnancy: G2P1001 at 6172w4d  1. Normal pregnancy, third trimester Term labor symptoms and general obstetric precautions including but not limited to vaginal bleeding, contractions, leaking of fluid and fetal movement were reviewed in detail with the patient. Please refer to After Visit Summary for other counseling recommendations.  Return in about 1 week (around 07/19/2016) for OB Visit.  Tereso NewcomerUgonna  A Nicklas Mcsweeney, MD

## 2016-07-19 ENCOUNTER — Ambulatory Visit (INDEPENDENT_AMBULATORY_CARE_PROVIDER_SITE_OTHER): Payer: BLUE CROSS/BLUE SHIELD | Admitting: Obstetrics & Gynecology

## 2016-07-19 VITALS — BP 104/69 | HR 99 | Wt 123.0 lb

## 2016-07-19 DIAGNOSIS — Z3493 Encounter for supervision of normal pregnancy, unspecified, third trimester: Secondary | ICD-10-CM

## 2016-07-19 NOTE — Progress Notes (Signed)
   PRENATAL VISIT NOTE  Subjective:  Phyllis [name] is a 30 y.o. G2P1001 at 7751w4d being seen today for ongoing prenatal care.  She is currently monitored for the following issues for this low-risk pregnancy and has Normal pregnancy and Ovarian cyst affecting pregnancy, antepartum on her problem list.  Patient reports no complaints.  Contractions: Irregular. Vag. Bleeding: None.  Movement: Present. Denies leaking of fluid.   The following portions of the patient's history were reviewed and updated as appropriate: allergies, current medications, past family history, past medical history, past social history, past surgical history and problem list. Problem list updated.  Objective:   Vitals:   07/19/16 1523  BP: 104/69  Pulse: 99  Weight: 123 lb (55.8 kg)    Fetal Status: Fetal Heart Rate (bpm): 139   Movement: Present     General:  Alert, oriented and cooperative. Patient is in no acute distress.  Skin: Skin is warm and dry. No rash noted.   Cardiovascular: Normal heart rate noted  Respiratory: Normal respiratory effort, no problems with respiration noted  Abdomen: Soft, gravid, appropriate for gestational age. Pain/Pressure: Present     Pelvic:  Cervical exam performed       2 10/02/48/-3  Extremities: Normal range of motion.  Edema: Trace  Mental Status: Normal mood and affect. Normal behavior. Normal judgment and thought content.   Assessment and Plan:  Pregnancy: G2P1001 at 8551w4d  1. Normal pregnancy in third trimester   Term labor symptoms and general obstetric precautions including but not limited to vaginal bleeding, contractions, leaking of fluid and fetal movement were reviewed in detail with the patient. Please refer to After Visit Summary for other counseling recommendations.  No Follow-up on file.  Allie BossierMyra C Andersyn Fragoso, MD

## 2016-07-21 ENCOUNTER — Encounter (HOSPITAL_COMMUNITY): Payer: Self-pay

## 2016-07-21 ENCOUNTER — Inpatient Hospital Stay (HOSPITAL_COMMUNITY)
Admission: AD | Admit: 2016-07-21 | Discharge: 2016-07-25 | DRG: 766 | Disposition: A | Discharge status: Home / Self Care | Payer: BLUE CROSS/BLUE SHIELD | Source: Ambulatory Visit | Attending: Obstetrics & Gynecology | Admitting: Obstetrics & Gynecology

## 2016-07-21 ENCOUNTER — Inpatient Hospital Stay (HOSPITAL_COMMUNITY): Payer: BLUE CROSS/BLUE SHIELD

## 2016-07-21 DIAGNOSIS — Z3403 Encounter for supervision of normal first pregnancy, third trimester: Secondary | ICD-10-CM | POA: Diagnosis present

## 2016-07-21 DIAGNOSIS — Z3490 Encounter for supervision of normal pregnancy, unspecified, unspecified trimester: Secondary | ICD-10-CM

## 2016-07-21 DIAGNOSIS — Z3A38 38 weeks gestation of pregnancy: Secondary | ICD-10-CM | POA: Diagnosis not present

## 2016-07-21 DIAGNOSIS — Z833 Family history of diabetes mellitus: Secondary | ICD-10-CM

## 2016-07-21 DIAGNOSIS — Z8249 Family history of ischemic heart disease and other diseases of the circulatory system: Secondary | ICD-10-CM

## 2016-07-21 DIAGNOSIS — O339 Maternal care for disproportion, unspecified: Secondary | ICD-10-CM | POA: Diagnosis present

## 2016-07-21 DIAGNOSIS — Z3493 Encounter for supervision of normal pregnancy, unspecified, third trimester: Secondary | ICD-10-CM

## 2016-07-21 LAB — CBC
HEMATOCRIT: 31.1 % — AB (ref 36.0–46.0)
HEMOGLOBIN: 10.1 g/dL — AB (ref 12.0–15.0)
MCH: 23.3 pg — ABNORMAL LOW (ref 26.0–34.0)
MCHC: 32.5 g/dL (ref 30.0–36.0)
MCV: 71.7 fL — ABNORMAL LOW (ref 78.0–100.0)
Platelets: 205 10*3/uL (ref 150–400)
RBC: 4.34 MIL/uL (ref 3.87–5.11)
RDW: 16 % — ABNORMAL HIGH (ref 11.5–15.5)
WBC: 7.9 10*3/uL (ref 4.0–10.5)

## 2016-07-21 LAB — TYPE AND SCREEN
ABO/RH(D): A POS
ANTIBODY SCREEN: NEGATIVE

## 2016-07-21 MED ORDER — EPHEDRINE 5 MG/ML INJ: 10.0000 mg | INTRAVENOUS | Status: DC | PRN

## 2016-07-21 MED ORDER — ONDANSETRON HCL 4 MG/2ML IJ SOLN
4.0000 mg | Freq: Four times a day (QID) | INTRAMUSCULAR | Status: DC | PRN
  Administered 2016-07-22: 4 mg via INTRAVENOUS
  Filled 2016-07-21: qty 2

## 2016-07-21 MED ORDER — LACTATED RINGERS IV SOLN
INTRAVENOUS | Status: DC
  Administered 2016-07-21 – 2016-07-22 (×3): via INTRAVENOUS

## 2016-07-21 MED ORDER — ACETAMINOPHEN 325 MG PO TABS: 650.0000 mg | ORAL_TABLET | ORAL | Status: DC | PRN

## 2016-07-21 MED ORDER — LACTATED RINGERS IV SOLN: 500.0000 mL | INTRAVENOUS | Status: DC | PRN

## 2016-07-21 MED ORDER — FENTANYL CITRATE (PF) 100 MCG/2ML IJ SOLN
INTRAMUSCULAR | Status: AC
  Filled 2016-07-21: qty 2

## 2016-07-21 MED ORDER — OXYTOCIN BOLUS FROM INFUSION: 500.0000 mL | Freq: Once | INTRAVENOUS | Status: DC

## 2016-07-21 MED ORDER — DIPHENHYDRAMINE HCL 50 MG/ML IJ SOLN: 12.5000 mg | INTRAMUSCULAR | Status: DC | PRN

## 2016-07-21 MED ORDER — PHENYLEPHRINE 40 MCG/ML (10ML) SYRINGE FOR IV PUSH (FOR BLOOD PRESSURE SUPPORT)
80.0000 ug | PREFILLED_SYRINGE | INTRAVENOUS | Status: DC | PRN
  Administered 2016-07-22: 80 ug via INTRAVENOUS
  Filled 2016-07-21 (×2): qty 10

## 2016-07-21 MED ORDER — LACTATED RINGERS IV SOLN: 500.0000 mL | Freq: Once | INTRAVENOUS | Status: DC

## 2016-07-21 MED ORDER — FENTANYL CITRATE (PF) 100 MCG/2ML IJ SOLN
100.0000 ug | INTRAMUSCULAR | Status: DC | PRN
  Administered 2016-07-21 – 2016-07-22 (×2): 100 ug via INTRAVENOUS
  Filled 2016-07-21: qty 2

## 2016-07-21 MED ORDER — FENTANYL 2.5 MCG/ML BUPIVACAINE 1/10 % EPIDURAL INFUSION (WH - ANES)
14.0000 mL/h | INTRAMUSCULAR | Status: DC | PRN
  Administered 2016-07-22: 12 mL/h via EPIDURAL
  Administered 2016-07-22: 14 mL/h via EPIDURAL
  Filled 2016-07-21: qty 125

## 2016-07-21 MED ORDER — OXYTOCIN 40 UNITS IN LACTATED RINGERS INFUSION - SIMPLE MED
2.5000 [IU]/h | INTRAVENOUS | Status: DC
  Filled 2016-07-21 (×2): qty 1000

## 2016-07-21 MED ORDER — OXYCODONE-ACETAMINOPHEN 5-325 MG PO TABS: 1.0000 | ORAL_TABLET | ORAL | Status: DC | PRN

## 2016-07-21 MED ORDER — PHENYLEPHRINE 40 MCG/ML (10ML) SYRINGE FOR IV PUSH (FOR BLOOD PRESSURE SUPPORT)
80.0000 ug | PREFILLED_SYRINGE | INTRAVENOUS | Status: DC | PRN
  Administered 2016-07-22: 80 ug via INTRAVENOUS

## 2016-07-21 MED ORDER — LIDOCAINE HCL (PF) 1 % IJ SOLN: 30.0000 mL | INTRAMUSCULAR | Status: DC | PRN

## 2016-07-21 MED ORDER — OXYCODONE-ACETAMINOPHEN 5-325 MG PO TABS: 2.0000 | ORAL_TABLET | ORAL | Status: DC | PRN

## 2016-07-21 MED ORDER — FLEET ENEMA 7-19 GM/118ML RE ENEM: 1.0000 | ENEMA | Freq: Every day | RECTAL | Status: DC | PRN

## 2016-07-21 MED ORDER — SOD CITRATE-CITRIC ACID 500-334 MG/5ML PO SOLN
30.0000 mL | ORAL | Status: DC | PRN
  Administered 2016-07-22: 30 mL via ORAL
  Filled 2016-07-21 (×2): qty 15

## 2016-07-21 NOTE — H&P (Signed)
LABOR AND DELIVERY ADMISSION HISTORY AND PHYSICAL NOTE  Phyllis [name] is a 30 y.o. female G2P1001 with IUP at 10949w6d by by ultrasound at 8 weeks presenting for SOL.   She reports positive fetal movement. She endorses leakage of clear vaginal fluid since 2300 with tinge of pink late this morning which prompted her to be seen in the MAU. While in the MAU patient fetal heart tracing with variable decels which resolved with positioning to lateral decub. BPP was ordered which was 8/8. Patient progressed from 3cm to 4cm during MAU observation  Prenatal History/Complications:  Past Medical History: Past Medical History:  Diagnosis Date  . Gestational diabetes mellitus, antepartum   . History of diet-controlled gestational diabetes mellitus 07/05/2014    Past Surgical History: Past Surgical History:  Procedure Laterality Date  . NO PAST SURGERIES      Obstetrical History: OB History    Gravida Para Term Preterm AB Living   2 1 1     1    SAB TAB Ectopic Multiple Live Births         0 1      Social History: Social History   Social History  . Marital status: Married    Spouse name: N/A  . Number of children: N/A  . Years of education: N/A   Social History Main Topics  . Smoking status: Never Smoker  . Smokeless tobacco: Never Used  . Alcohol use No     Comment: occassionally  . Drug use: No  . Sexual activity: Yes    Partners: Male   Other Topics Concern  . None   Social History Narrative  . None    Family History: Family History  Problem Relation Age of Onset  . Multiple sclerosis Mother   . Hypertension Paternal Aunt   . Cancer Maternal Grandmother     breast  . Cancer Maternal Aunt     breast  . Asthma Other   . Diabetes Other     Allergies: Allergies  Allergen Reactions  . Latex Itching and Rash    Prescriptions Prior to Admission  Medication Sig Dispense Refill Last Dose  . Prenat w/o A Vit-FeFum-FePo-FA (CONCEPT OB) 130-92.4-1 MG CAPS Take 1  capsule by mouth daily.        Review of Systems   All systems reviewed and negative except as stated in HPI  Temperature 98.4 F (36.9 C), resp. rate 16, height 5\' 3"  (1.6 m), weight 123 lb (55.8 kg), last menstrual period 10/23/2015, currently breastfeeding. General appearance: alert, cooperative, appears stated age and no distress Heart: regular rateand rhythm Abdomen: soft, non-tender; bowel sounds normal Extremities: No calf swelling or tenderness Fetal monitoring: Category 1 Uterine activity: Q2-3 min Dilation: 4 Effacement (%): 70 Station: -2 Exam by:: Dione PloverA. Jones RNC   Prenatal labs: ABO, Rh: A/POS/-- (03/24 1027) Antibody: NEG (03/24 1027) Rubella: !Error! RPR: NON REAC (08/17 0846)  HBsAg: NEGATIVE (03/24 1027)  HIV: NONREACTIVE (08/17 0844)  GBS:    1 hr Glucola: 130 Genetic screening:  None Anatomy US: @ 18 weeks  Prenatal Transfer Tool  Maternal Diabetes: No Genetic Screening: None Maternal Ultrasounds/Referrals: Abnormal:  Findings:   Cardiac defect (suspected to be a calcified papillary muscle) Fetal Ultrasounds or other Referrals:  Referred to Materal Fetal Medicine  Maternal Substance Abuse:  No Significant Maternal Medications:  None Significant Maternal Lab Results: None  No results found for this or any previous visit (from the past 24 hour(s)).  Patient Active Problem  List   Diagnosis Date Noted  . Pregnancy 07/21/2016  . Ovarian cyst affecting pregnancy, antepartum 12/29/2015  . Normal pregnancy 12/23/2015    Assessment: Phyllis [name] is a 30 y.o. G2P1001 at [redacted]w[redacted]d here for SOL  #Labor:SOL expectant management, pt is not ruptured at this time. #Pain: Patient would like to avoid pain medicines. Was able to reach 7cm before epidural at last birth and would like to attempt natural delivery #FWB: Category 1 #ID:  GBS negative (10/05) #MOF: Breast #MOC:Undecided #Circ:  Patient does not want to know the sex of baby (per note)   Josue D  Santos 07/21/2016, 7:30 PM  OB FELLOW HISTORY AND PHYSICAL ATTESTATION  I have seen and examined this patient; I agree with above documentation in the resident's note.    Ernestina Penna 07/21/2016, 8:36 PM

## 2016-07-21 NOTE — MAU Note (Signed)
Lost mucus plug yesterday, had some braxton hicks contractions, still having light bleeding.

## 2016-07-21 NOTE — Anesthesia Pain Management Evaluation Note (Signed)
  CRNA Pain Management Visit Note  Patient: Phyllis [name], 30 y.o., female  "Hello I am a member of the anesthesia team at [facility]. We have an anesthesia team available at all times to provide care throughout the hospital, including epidural management and anesthesia for C-section. I don't know your plan for the delivery whether it a natural birth, water birth, IV sedation, nitrous supplementation, doula or epidural, but we want to meet your pain goals."   1.Was your pain managed to your expectations on prior hospitalizations?   Yes   2.What is your expectation for pain management during this hospitalization?     Epidural and IV pain meds  3.How can we help you reach that goal? Possible epidural  Record the patient's initial score and the patient's pain goal.   Pain: 1  Pain Goal: 7 [facility] wants you to be able to say your pain was always managed very well.  Demaria Deeney 07/21/2016

## 2016-07-22 ENCOUNTER — Encounter (HOSPITAL_COMMUNITY)
Admission: AD | Disposition: A | Discharge status: Home / Self Care | Payer: Self-pay | Source: Ambulatory Visit | Attending: Obstetrics & Gynecology

## 2016-07-22 ENCOUNTER — Encounter (HOSPITAL_COMMUNITY): Payer: Self-pay | Admitting: Certified Registered Nurse Anesthetist

## 2016-07-22 ENCOUNTER — Inpatient Hospital Stay (HOSPITAL_COMMUNITY): Payer: BLUE CROSS/BLUE SHIELD | Admitting: Anesthesiology

## 2016-07-22 LAB — RPR: RPR: NONREACTIVE

## 2016-07-22 SURGERY — Surgical Case
Anesthesia: Regional | Wound class: Clean Contaminated

## 2016-07-22 MED ORDER — ONDANSETRON HCL 4 MG PO TABS: 4.0000 mg | ORAL_TABLET | ORAL | Status: DC | PRN

## 2016-07-22 MED ORDER — LACTATED RINGERS IV SOLN
INTRAVENOUS | Status: DC | PRN
  Administered 2016-07-22 (×2): via INTRAVENOUS

## 2016-07-22 MED ORDER — LACTATED RINGERS IV SOLN: 500.0000 mL | Freq: Once | INTRAVENOUS | Status: DC

## 2016-07-22 MED ORDER — OXYTOCIN 10 UNIT/ML IJ SOLN
INTRAMUSCULAR | Status: AC
  Filled 2016-07-22: qty 4

## 2016-07-22 MED ORDER — TETANUS-DIPHTH-ACELL PERTUSSIS 5-2.5-18.5 LF-MCG/0.5 IM SUSP: 0.5000 mL | Freq: Once | INTRAMUSCULAR | Status: DC

## 2016-07-22 MED ORDER — LACTATED RINGERS IV SOLN
INTRAVENOUS | Status: DC
  Administered 2016-07-22: 05:00:00 via INTRAUTERINE

## 2016-07-22 MED ORDER — OXYCODONE-ACETAMINOPHEN 5-325 MG PO TABS
1.0000 | ORAL_TABLET | ORAL | Status: DC | PRN
  Administered 2016-07-22: 1 via ORAL
  Filled 2016-07-22: qty 1

## 2016-07-22 MED ORDER — LACTATED RINGERS IV SOLN: INTRAVENOUS | Status: DC

## 2016-07-22 MED ORDER — ONDANSETRON HCL 4 MG/2ML IJ SOLN: 4.0000 mg | Freq: Four times a day (QID) | INTRAMUSCULAR | Status: DC | PRN

## 2016-07-22 MED ORDER — FENTANYL CITRATE (PF) 100 MCG/2ML IJ SOLN
25.0000 ug | INTRAMUSCULAR | Status: DC | PRN
Start: 2016-07-22 — End: 2016-07-22
  Administered 2016-07-22: 50 ug via INTRAVENOUS

## 2016-07-22 MED ORDER — DIBUCAINE 1 % RE OINT
1.0000 | TOPICAL_OINTMENT | RECTAL | Status: DC | PRN
Start: 2016-07-22 — End: 2016-07-25

## 2016-07-22 MED ORDER — KETOROLAC TROMETHAMINE 30 MG/ML IJ SOLN
30.0000 mg | Freq: Four times a day (QID) | INTRAMUSCULAR | Status: AC | PRN
  Administered 2016-07-22: 30 mg via INTRAVENOUS
  Filled 2016-07-22: qty 1

## 2016-07-22 MED ORDER — OXYTOCIN 40 UNITS IN LACTATED RINGERS INFUSION - SIMPLE MED: 2.5000 [IU]/h | INTRAVENOUS | Status: DC

## 2016-07-22 MED ORDER — NALBUPHINE HCL 10 MG/ML IJ SOLN: 5.0000 mg | Freq: Once | INTRAMUSCULAR | Status: DC | PRN

## 2016-07-22 MED ORDER — METOCLOPRAMIDE HCL 5 MG/ML IJ SOLN: 10.0000 mg | Freq: Once | INTRAMUSCULAR | Status: DC | PRN

## 2016-07-22 MED ORDER — OXYCODONE-ACETAMINOPHEN 5-325 MG PO TABS: 2.0000 | ORAL_TABLET | ORAL | Status: DC | PRN

## 2016-07-22 MED ORDER — MORPHINE SULFATE (PF) 0.5 MG/ML IJ SOLN
INTRAMUSCULAR | Status: DC | PRN
  Administered 2016-07-22: 3 mg via EPIDURAL

## 2016-07-22 MED ORDER — OXYTOCIN BOLUS FROM INFUSION: 500.0000 mL | Freq: Once | INTRAVENOUS | Status: DC

## 2016-07-22 MED ORDER — IBUPROFEN 600 MG PO TABS
600.0000 mg | ORAL_TABLET | Freq: Four times a day (QID) | ORAL | Status: DC
  Administered 2016-07-23 – 2016-07-25 (×11): 600 mg via ORAL
  Filled 2016-07-22 (×11): qty 1

## 2016-07-22 MED ORDER — ONDANSETRON HCL 4 MG/2ML IJ SOLN
INTRAMUSCULAR | Status: AC
  Filled 2016-07-22: qty 2

## 2016-07-22 MED ORDER — MEPERIDINE HCL 25 MG/ML IJ SOLN
INTRAMUSCULAR | Status: DC | PRN
  Administered 2016-07-22: 12.5 mg via INTRAVENOUS

## 2016-07-22 MED ORDER — SODIUM CHLORIDE 0.9 % IR SOLN
Status: DC | PRN
  Administered 2016-07-22: 1

## 2016-07-22 MED ORDER — SCOPOLAMINE 1 MG/3DAYS TD PT72
MEDICATED_PATCH | TRANSDERMAL | Status: AC
  Filled 2016-07-22: qty 1

## 2016-07-22 MED ORDER — NALBUPHINE HCL 10 MG/ML IJ SOLN
5.0000 mg | INTRAMUSCULAR | Status: DC | PRN
  Administered 2016-07-22: 5 mg via SUBCUTANEOUS
  Filled 2016-07-22: qty 1

## 2016-07-22 MED ORDER — LACTATED RINGERS IV SOLN
INTRAVENOUS | Status: DC
  Administered 2016-07-22: 21:00:00 via INTRAVENOUS

## 2016-07-22 MED ORDER — FENTANYL CITRATE (PF) 100 MCG/2ML IJ SOLN
INTRAMUSCULAR | Status: AC
  Filled 2016-07-22: qty 2

## 2016-07-22 MED ORDER — FENTANYL CITRATE (PF) 100 MCG/2ML IJ SOLN: 100.0000 ug | INTRAMUSCULAR | Status: DC | PRN

## 2016-07-22 MED ORDER — ZOLPIDEM TARTRATE 5 MG PO TABS: 5.0000 mg | ORAL_TABLET | Freq: Every evening | ORAL | Status: DC | PRN

## 2016-07-22 MED ORDER — LACTATED RINGERS IV SOLN
INTRAVENOUS | Status: DC | PRN
  Administered 2016-07-22: 12:00:00 via INTRAVENOUS

## 2016-07-22 MED ORDER — MORPHINE SULFATE (PF) 0.5 MG/ML IJ SOLN
INTRAMUSCULAR | Status: AC
  Filled 2016-07-22: qty 10

## 2016-07-22 MED ORDER — SCOPOLAMINE 1 MG/3DAYS TD PT72
MEDICATED_PATCH | TRANSDERMAL | Status: DC | PRN
  Administered 2016-07-22: 1 via TRANSDERMAL

## 2016-07-22 MED ORDER — ACETAMINOPHEN 325 MG PO TABS: 650.0000 mg | ORAL_TABLET | ORAL | Status: DC | PRN

## 2016-07-22 MED ORDER — LIDOCAINE HCL (PF) 1 % IJ SOLN
INTRAMUSCULAR | Status: DC | PRN
  Administered 2016-07-22: 3 mL via EPIDURAL
  Administered 2016-07-22: 2 mL via EPIDURAL
  Administered 2016-07-22: 5 mL via EPIDURAL

## 2016-07-22 MED ORDER — BUPIVACAINE HCL (PF) 0.25 % IJ SOLN
INTRAMUSCULAR | Status: DC | PRN
  Administered 2016-07-22 (×2): 5 mL via EPIDURAL

## 2016-07-22 MED ORDER — DIPHENHYDRAMINE HCL 25 MG PO CAPS: 25.0000 mg | ORAL_CAPSULE | ORAL | Status: DC | PRN

## 2016-07-22 MED ORDER — CHLOROPROCAINE HCL (PF) 3 % IJ SOLN
INTRAMUSCULAR | Status: AC
  Filled 2016-07-22: qty 20

## 2016-07-22 MED ORDER — NALOXONE HCL 0.4 MG/ML IJ SOLN: 0.4000 mg | INTRAMUSCULAR | Status: DC | PRN

## 2016-07-22 MED ORDER — DEXTROSE 5 % IV SOLN
1.0000 ug/kg/h | INTRAVENOUS | Status: DC | PRN
  Filled 2016-07-22: qty 2

## 2016-07-22 MED ORDER — SOD CITRATE-CITRIC ACID 500-334 MG/5ML PO SOLN: 30.0000 mL | ORAL | Status: DC | PRN

## 2016-07-22 MED ORDER — SCOPOLAMINE 1 MG/3DAYS TD PT72
1.0000 | MEDICATED_PATCH | Freq: Once | TRANSDERMAL | Status: DC
  Filled 2016-07-22: qty 1

## 2016-07-22 MED ORDER — ACETAMINOPHEN 325 MG PO TABS
650.0000 mg | ORAL_TABLET | ORAL | Status: DC | PRN
  Administered 2016-07-22: 650 mg via ORAL
  Filled 2016-07-22: qty 2

## 2016-07-22 MED ORDER — OXYTOCIN 40 UNITS IN LACTATED RINGERS INFUSION - SIMPLE MED
1.0000 m[IU]/min | INTRAVENOUS | Status: DC
  Administered 2016-07-22: 4 m[IU]/min via INTRAVENOUS
  Administered 2016-07-22: 2 m[IU]/min via INTRAVENOUS

## 2016-07-22 MED ORDER — BUPIVACAINE HCL (PF) 0.5 % IJ SOLN
INTRAMUSCULAR | Status: AC
  Filled 2016-07-22: qty 30

## 2016-07-22 MED ORDER — BUPIVACAINE HCL (PF) 0.5 % IJ SOLN
INTRAMUSCULAR | Status: DC | PRN
  Administered 2016-07-22: 20 mL

## 2016-07-22 MED ORDER — OXYTOCIN 10 UNIT/ML IJ SOLN
INTRAVENOUS | Status: DC | PRN
  Administered 2016-07-22: 40 [IU] via INTRAVENOUS

## 2016-07-22 MED ORDER — SIMETHICONE 80 MG PO CHEW
80.0000 mg | CHEWABLE_TABLET | ORAL | Status: DC | PRN
  Administered 2016-07-23 (×2): 80 mg via ORAL
  Filled 2016-07-22 (×2): qty 1

## 2016-07-22 MED ORDER — PRENATAL MULTIVITAMIN CH
1.0000 | ORAL_TABLET | Freq: Every day | ORAL | Status: DC
  Administered 2016-07-23 – 2016-07-25 (×3): 1 via ORAL
  Filled 2016-07-22 (×3): qty 1

## 2016-07-22 MED ORDER — CEFAZOLIN SODIUM-DEXTROSE 2-4 GM/100ML-% IV SOLN
INTRAVENOUS | Status: AC
  Filled 2016-07-22: qty 100

## 2016-07-22 MED ORDER — ONDANSETRON HCL 4 MG/2ML IJ SOLN: 4.0000 mg | Freq: Three times a day (TID) | INTRAMUSCULAR | Status: DC | PRN

## 2016-07-22 MED ORDER — LIDOCAINE-EPINEPHRINE (PF) 2 %-1:200000 IJ SOLN
INTRAMUSCULAR | Status: AC
  Filled 2016-07-22: qty 20

## 2016-07-22 MED ORDER — ONDANSETRON HCL 4 MG/2ML IJ SOLN
INTRAMUSCULAR | Status: DC | PRN
  Administered 2016-07-22: 4 mg via INTRAVENOUS

## 2016-07-22 MED ORDER — LACTATED RINGERS IV SOLN: 500.0000 mL | INTRAVENOUS | Status: DC | PRN

## 2016-07-22 MED ORDER — BENZOCAINE-MENTHOL 20-0.5 % EX AERO: 1.0000 "application " | INHALATION_SPRAY | CUTANEOUS | Status: DC | PRN

## 2016-07-22 MED ORDER — WITCH HAZEL-GLYCERIN EX PADS: 1.0000 "application " | MEDICATED_PAD | CUTANEOUS | Status: DC | PRN

## 2016-07-22 MED ORDER — DIPHENHYDRAMINE HCL 25 MG PO CAPS
25.0000 mg | ORAL_CAPSULE | Freq: Four times a day (QID) | ORAL | Status: DC | PRN
  Administered 2016-07-23: 25 mg via ORAL
  Filled 2016-07-22: qty 1

## 2016-07-22 MED ORDER — DIPHENHYDRAMINE HCL 50 MG/ML IJ SOLN: 12.5000 mg | INTRAMUSCULAR | Status: DC | PRN

## 2016-07-22 MED ORDER — ONDANSETRON HCL 4 MG/2ML IJ SOLN: 4.0000 mg | INTRAMUSCULAR | Status: DC | PRN

## 2016-07-22 MED ORDER — KETOROLAC TROMETHAMINE 30 MG/ML IJ SOLN: 30.0000 mg | Freq: Four times a day (QID) | INTRAMUSCULAR | Status: AC | PRN

## 2016-07-22 MED ORDER — MEPERIDINE HCL 25 MG/ML IJ SOLN: 6.2500 mg | INTRAMUSCULAR | Status: DC | PRN

## 2016-07-22 MED ORDER — CEFAZOLIN SODIUM-DEXTROSE 2-3 GM-% IV SOLR
INTRAVENOUS | Status: DC | PRN
  Administered 2016-07-22: 2 g via INTRAVENOUS

## 2016-07-22 MED ORDER — SODIUM BICARBONATE 8.4 % IV SOLN
INTRAVENOUS | Status: DC | PRN
  Administered 2016-07-22 (×6): 5 mL via EPIDURAL

## 2016-07-22 MED ORDER — FENTANYL CITRATE (PF) 100 MCG/2ML IJ SOLN
INTRAMUSCULAR | Status: DC | PRN
  Administered 2016-07-22: 100 ug via EPIDURAL

## 2016-07-22 MED ORDER — LIDOCAINE HCL (PF) 1 % IJ SOLN
30.0000 mL | INTRAMUSCULAR | Status: DC | PRN
  Filled 2016-07-22: qty 30

## 2016-07-22 MED ORDER — NALBUPHINE HCL 10 MG/ML IJ SOLN: 5.0000 mg | INTRAMUSCULAR | Status: DC | PRN

## 2016-07-22 MED ORDER — MEPERIDINE HCL 25 MG/ML IJ SOLN
INTRAMUSCULAR | Status: AC
  Filled 2016-07-22: qty 1

## 2016-07-22 MED ORDER — TERBUTALINE SULFATE 1 MG/ML IJ SOLN
0.2500 mg | Freq: Once | INTRAMUSCULAR | Status: AC | PRN
  Administered 2016-07-22: 0.25 mg via SUBCUTANEOUS
  Filled 2016-07-22: qty 1

## 2016-07-22 MED ORDER — SENNOSIDES-DOCUSATE SODIUM 8.6-50 MG PO TABS
2.0000 | ORAL_TABLET | ORAL | Status: DC
  Administered 2016-07-23 – 2016-07-24 (×3): 2 via ORAL
  Filled 2016-07-22 (×3): qty 2

## 2016-07-22 MED ORDER — SODIUM CHLORIDE 0.9% FLUSH: 3.0000 mL | INTRAVENOUS | Status: DC | PRN

## 2016-07-22 MED ORDER — COCONUT OIL OIL
1.0000 "application " | TOPICAL_OIL | Status: DC | PRN
  Administered 2016-07-23: 1 via TOPICAL
  Filled 2016-07-22: qty 120

## 2016-07-22 SURGICAL SUPPLY — 34 items
BENZOIN TINCTURE PRP APPL 2/3 (GAUZE/BANDAGES/DRESSINGS) ×3 IMPLANT
CHLORAPREP W/TINT 26ML (MISCELLANEOUS) ×3 IMPLANT
CLAMP CORD UMBIL (MISCELLANEOUS) IMPLANT
CLOSURE WOUND 1/2 X4 (GAUZE/BANDAGES/DRESSINGS) ×1
CLOTH BEACON ORANGE TIMEOUT ST (SAFETY) ×3 IMPLANT
DRSG OPSITE POSTOP 4X10 (GAUZE/BANDAGES/DRESSINGS) ×3 IMPLANT
ELECT REM PT RETURN 9FT ADLT (ELECTROSURGICAL) ×3
ELECTRODE REM PT RTRN 9FT ADLT (ELECTROSURGICAL) ×1 IMPLANT
EXTRACTOR VACUUM M CUP 4 TUBE (SUCTIONS) IMPLANT
EXTRACTOR VACUUM M CUP 4' TUBE (SUCTIONS)
GLOVE BIOGEL PI IND STRL 7.0 (GLOVE) ×3 IMPLANT
GLOVE BIOGEL PI INDICATOR 7.0 (GLOVE) ×6
GLOVE ECLIPSE 7.0 STRL STRAW (GLOVE) ×3 IMPLANT
GOWN STRL REUS W/TWL LRG LVL3 (GOWN DISPOSABLE) ×6 IMPLANT
KIT ABG SYR 3ML LUER SLIP (SYRINGE) IMPLANT
NEEDLE HYPO 22GX1.5 SAFETY (NEEDLE) ×3 IMPLANT
NEEDLE HYPO 25X5/8 SAFETYGLIDE (NEEDLE) ×3 IMPLANT
NS IRRIG 1000ML POUR BTL (IV SOLUTION) ×3 IMPLANT
PACK C SECTION WH (CUSTOM PROCEDURE TRAY) ×3 IMPLANT
PAD ABD 7.5X8 STRL (GAUZE/BANDAGES/DRESSINGS) ×3 IMPLANT
PAD OB MATERNITY 4.3X12.25 (PERSONAL CARE ITEMS) ×3 IMPLANT
PENCIL SMOKE EVAC W/HOLSTER (ELECTROSURGICAL) ×3 IMPLANT
RTRCTR C-SECT PINK 25CM LRG (MISCELLANEOUS) IMPLANT
STRIP CLOSURE SKIN 1/2X4 (GAUZE/BANDAGES/DRESSINGS) ×2 IMPLANT
SUT PDS AB 0 CTX 36 PDP370T (SUTURE) ×3 IMPLANT
SUT PLAIN 2 0 (SUTURE) ×2
SUT PLAIN 2 0 XLH (SUTURE) IMPLANT
SUT PLAIN ABS 2-0 CT1 27XMFL (SUTURE) ×1 IMPLANT
SUT VIC AB 0 CTX 36 (SUTURE) ×6
SUT VIC AB 0 CTX36XBRD ANBCTRL (SUTURE) ×3 IMPLANT
SUT VIC AB 4-0 KS 27 (SUTURE) ×3 IMPLANT
SYR CONTROL 10ML LL (SYRINGE) ×3 IMPLANT
TOWEL OR 17X24 6PK STRL BLUE (TOWEL DISPOSABLE) ×3 IMPLANT
TRAY FOLEY CATH SILVER 14FR (SET/KITS/TRAYS/PACK) ×3 IMPLANT

## 2016-07-22 NOTE — Anesthesia Procedure Notes (Signed)
Epidural Patient location during procedure: OB  Staffing Anesthesiologist: TURK, STEPHEN EDWARD Performed: anesthesiologist   Preanesthetic Checklist Completed: patient identified, pre-op evaluation, timeout performed, IV checked, risks and benefits discussed and monitors and equipment checked  Epidural Patient position: sitting Prep: DuraPrep Patient monitoring: blood pressure and continuous pulse ox Approach: midline Location: L3-L4 Injection technique: LOR air  Needle:  Needle type: Tuohy  Needle gauge: 17 G Needle length: 9 cm Needle insertion depth: 4 cm Catheter size: 19 Gauge Catheter at skin depth: 8 cm Test dose: negative and Other (1% Lidocaine)  Additional Notes Patient identified.  Risk benefits discussed including failed block, incomplete pain control, headache, nerve damage, paralysis, blood pressure changes, nausea, vomiting, reactions to medication both toxic or allergic, and postpartum back pain.  Patient expressed understanding and wished to proceed.  All questions were answered.  Sterile technique used throughout procedure and epidural site dressed with sterile barrier dressing. No paresthesia or other complications noted. The patient did not experience any signs of intravascular injection such as tinnitus or metallic taste in mouth nor signs of intrathecal spread such as rapid motor block. Please see nursing notes for vital signs. Reason for block:procedure for pain     

## 2016-07-22 NOTE — Anesthesia Postprocedure Evaluation (Signed)
Anesthesia Post Note  Patient: Phyllis [name]  Procedure(s) Performed: Procedure(s) (LRB): CESAREAN SECTION (N/A)  Patient location during evaluation: Mother Baby Anesthesia Type: Epidural Level of consciousness: awake Pain management: pain level controlled Vital Signs Assessment: post-procedure vital signs reviewed and stable Respiratory status: spontaneous breathing Cardiovascular status: stable Postop Assessment: no headache, no backache, spinal receding, patient able to bend at knees, no signs of nausea or vomiting and adequate PO intake Anesthetic complications: no     Last Vitals:  Vitals:   07/22/16 1432 07/22/16 1443  BP:  105/68  Pulse: 86 95  Resp: 17 18  Temp:  36.9 C    Last Pain:  Vitals:   07/22/16 1443  TempSrc: Oral  PainSc:    Pain Goal: Patients Stated Pain Goal: 6 (07/21/16 1949)               [name],[name]

## 2016-07-22 NOTE — Progress Notes (Signed)
Amnioinfusion stopped. IUPC dc'd.

## 2016-07-22 NOTE — Progress Notes (Signed)
Pt seen. Doing well. Pain is ok. Did ask for dose of IV pain medication. Pt has dysfunctional contraction pattern, that is very uncomfortable. She will have series of contraction q1-2 minutes and then they will space out. Will start pitocin to attempt to organize contraction pattern. PT with occasional variable decelerations. Cervix 5cm by last nursing check.

## 2016-07-22 NOTE — Progress Notes (Signed)
[place] Phyllis [name] is a 30 y.o. G2P1001 at 5440w0d  admitted for active labor  Subjective: Called to see pt due to recurrent decels in to 80's x 45 -60 secs with contractions. Pt has had d/c of pitocin, IUPC, and amnioinfusion. Terbutaline used but decels are recurrent , with each contraction, and we are quite some time from delivery. Presenting part not descending well.  Options discussed with pt, and she supports proceeding to cesarean delivery for nonreassuring fetal status. Objective: BP 101/65   Pulse 69   Temp 98.1 F (36.7 C) (Oral)   Resp 20   Ht 5\' 3"  (1.6 m)   Wt 55.8 kg (123 lb)   LMP 10/23/2015   BMI 21.79 kg/m  No intake/output data recorded. No intake/output data recorded.  FHT:  FHR: 150 bpm, variability: moderate,  accelerations:  Present,  decelerations:  Present recurrent deep variables with each contraction despite interventions UC:   regular, every 3-4 minutes SVE:   Dilation: 7 Effacement (%): 100 Station: -2, -1 Exam by:: dr Emelda FearFerguson  Labs: Lab Results  Component Value Date   WBC 7.9 07/21/2016   HGB 10.1 (L) 07/21/2016   HCT 31.1 (L) 07/21/2016   MCV 71.7 (L) 07/21/2016   PLT 205 07/21/2016    Assessment / Plan: nonreassuring fetal status/ intolerance of labor.  Labor: terbutaline administered Preeclampsia:   Fetal Wellbeing:  Category II Pain Control:  Epidural I/D:  n/a Anticipated MOD:  cesarean delivery. risks options rationale discussed . will proceed to c/s delivery. No plans for permanent sterililzation at this time.  Bentley Fissel V 07/22/2016, 9:12 AM

## 2016-07-22 NOTE — Transfer of Care (Signed)
Immediate Anesthesia Transfer of Care Note  Patient: Phyllis [name]  Procedure(s) Performed: Procedure(s): CESAREAN SECTION (N/A)  Patient Location: PACU  Anesthesia Type:Epidural  Level of Consciousness: awake, alert , oriented and patient cooperative  Airway & Oxygen Therapy: Patient Spontanous Breathing  Post-op Assessment: Report given to RN and Post -op Vital signs reviewed and stable  Post vital signs: Reviewed and stable  Last Vitals:  Vitals:   07/22/16 1040 07/22/16 1101  BP:    Pulse:    Resp: 18 18  Temp: 37.8 C     Last Pain:  Vitals:   07/22/16 1101  TempSrc:   PainSc: 0-No pain      Patients Stated Pain Goal: 6 (07/21/16 1949)  Complications: No apparent anesthesia complications

## 2016-07-22 NOTE — Anesthesia Preprocedure Evaluation (Signed)
Anesthesia Evaluation  Patient identified by MRN, date of birth, ID band Patient awake    Reviewed: Allergy & Precautions, NPO status , Patient's Chart, lab work & pertinent test results  Airway Mallampati: II  TM Distance: >3 FB Neck ROM: Full    Dental  (+) Teeth Intact, Dental Advisory Given   Pulmonary neg pulmonary ROS,    Pulmonary exam normal breath sounds clear to auscultation       Cardiovascular Exercise Tolerance: Good negative cardio ROS Normal cardiovascular exam Rhythm:Regular Rate:Normal     Neuro/Psych negative neurological ROS  negative psych ROS   GI/Hepatic negative GI ROS, Neg liver ROS,   Endo/Other  diabetes (prior pregnancy), Gestational  Renal/GU negative Renal ROS     Musculoskeletal negative musculoskeletal ROS (+)   Abdominal   Peds  Hematology  (+) Blood dyscrasia, anemia ,   Anesthesia Other Findings Day of surgery medications reviewed with the patient.  Reproductive/Obstetrics (+) Pregnancy                             Anesthesia Physical Anesthesia Plan  ASA: II  Anesthesia Plan: Epidural   Post-op Pain Management:    Induction:   Airway Management Planned:   Additional Equipment:   Intra-op Plan:   Post-operative Plan:   Informed Consent: I have reviewed the patients History and Physical, chart, labs and discussed the procedure including the risks, benefits and alternatives for the proposed anesthesia with the patient or authorized representative who has indicated his/her understanding and acceptance.   Dental advisory given  Plan Discussed with:   Anesthesia Plan Comments: (Patient identified. Risks/Benefits/Options discussed with patient including but not limited to bleeding, infection, nerve damage, paralysis, failed block, incomplete pain control, headache, blood pressure changes, nausea, vomiting, reactions to medication both or  allergic, itching and postpartum back pain. Confirmed with bedside nurse the patient's most recent platelet count. Confirmed with patient that they are not currently taking any anticoagulation, have any bleeding history or any family history of bleeding disorders. Patient expressed understanding and wished to proceed. All questions were answered. )        Anesthesia Quick Evaluation

## 2016-07-22 NOTE — Progress Notes (Signed)
Called to evaluate pt for reccurent variables since 420AM. They have attempted repositioning without muhc improvement. Good variability noted. Pt check and 7/80/-1. Variables do not seem to have improved with repositioning. IUPC placed and amnioinfusion started.

## 2016-07-23 LAB — CBC
HEMATOCRIT: 20.9 % — AB (ref 36.0–46.0)
HEMOGLOBIN: 7.1 g/dL — AB (ref 12.0–15.0)
MCH: 23.7 pg — AB (ref 26.0–34.0)
MCHC: 33.5 g/dL (ref 30.0–36.0)
MCV: 70.8 fL — ABNORMAL LOW (ref 78.0–100.0)
Platelets: 146 10*3/uL — ABNORMAL LOW (ref 150–400)
RBC: 2.95 MIL/uL — AB (ref 3.87–5.11)
RDW: 15.9 % — ABNORMAL HIGH (ref 11.5–15.5)
WBC: 7.8 10*3/uL (ref 4.0–10.5)

## 2016-07-23 MED ORDER — OXYCODONE-ACETAMINOPHEN 5-325 MG PO TABS
1.0000 | ORAL_TABLET | ORAL | Status: DC | PRN
  Administered 2016-07-23 – 2016-07-25 (×8): 1 via ORAL
  Filled 2016-07-23 (×2): qty 1
  Filled 2016-07-23: qty 2
  Filled 2016-07-23 (×5): qty 1

## 2016-07-23 NOTE — Progress Notes (Signed)
Subjective: Postpartum Day 1: Cesarean Delivery Patient reports incisional pain and tolerating PO.    Objective: Vital signs in last 24 hours: Temp:  [97.9 F (36.6 C)-100 F (37.8 C)] 97.9 F (36.6 C) (10/23 0230) Pulse Rate:  [61-112] 61 (10/23 0230) Resp:  [1-34] 16 (10/23 0230) BP: (74-121)/(48-75) 83/50 (10/23 0230) SpO2:  [96 %-100 %] 96 % (10/22 1800)  Physical Exam:  General: alert, cooperative, appears stated age and no distress Lochia: appropriate Uterine Fundus: firm Incision: healing well, no significant drainage, no dehiscence, no significant erythema DVT Evaluation: No evidence of DVT seen on physical exam.   Recent Labs  07/21/16 1714  HGB 10.1*  HCT 31.1*    Assessment/Plan: Status post Cesarean section. Doing well postoperatively.  Continue current care.  Phyllis [name] 07/23/2016, 6:23 AM

## 2016-07-23 NOTE — Lactation Note (Signed)
This note was copied from a baby's chart. Lactation Consultation Note Experienced BF mom to her now 28 yr old for 6 weeks. When LC entered rm. Mom was sitting on side of bed fixing to use DEBP. Noted moms breast filling. Mom stated they were getting heavy. Denied leaking. Hand expressed w/easy flow of colostrum. Baby hasn't been interested in BF d/t spitty. Educated of newborn behavior and feeding habits. Mom pumped Rt. Breast 13ml colostrum.  Baby making noises while RN assessing d/t spitty. After assessment, feeding cues noted. Assisted in football hold baby to breast. Baby rooting wanting breast, suck 1-2 times, then pop off and on. Has recessed chin, needs chin tug.  Mom has good everted nipples w/bulb shaped areolas. Compressed well w/colostrum noted. Baby working hard to BF getting frustrated. Encouraged mom to put STS let baby rest.  Mom encouraged to feed baby 8-12 times/24 hours and with feeding cues. Educated on cluster feeding, after spittiness over, supply and demand, importance of I&O. WH/LC brochure given w/resources, support groups and LC services. Patient Name: Phyllis [name] Today's Date: 07/23/2016 Reason for consult: Initial assessment   Maternal Data Has patient been taught Hand Expression?: Yes Does the patient have breastfeeding experience prior to this delivery?: Yes  Feeding Feeding Type: Breast Fed Length of feed: 0 min  LATCH Score/Interventions Latch: Too sleepy or reluctant, no latch achieved, no sucking elicited.     Type of Nipple: Everted at rest and after stimulation  Comfort (Breast/Nipple): Filling, red/small blisters or bruises, mild/mod discomfort  Problem noted: Filling Interventions (Filling): Massage;Frequent nursing;Double electric pump  Hold (Positioning): Assistance needed to correctly position infant at breast and maintain latch. Intervention(s): Breastfeeding basics reviewed;Support Pillows;Position options;Skin to skin     Lactation  Tools Discussed/Used Tools: Pump Breast pump type: Double-Electric Breast Pump Pump Review: Setup, frequency, and cleaning;Milk Storage Initiated by:: RN Date initiated:: 07/22/16   Consult Status Consult Status: Follow-up Date: 07/23/16 Follow-up type: In-patient    Keryl Gholson, Diamond NickelLAURA G 07/23/2016, 1:36 AM

## 2016-07-24 NOTE — Lactation Note (Signed)
This note was copied from a baby's chart. Lactation Consultation Note  Patient Name: Phyllis [name] JXBJY'NToday's Date: 07/24/2016 Reason for consult: Follow-up assessment (LC assessed Latch with orientee )  Baby is 6947 hours old, parents called baby waking up, and LC and orientee  1st attempted to latch, baby sluggish, set up the DEBP and reviewed set up ,  #24 Flange  A good flange. Baby stirring and LC re- latched in football position after several attempts to obtain  Depth , and once latched was able to sustain latch and still feeding at 15 mins , and MBU RN aware to  Document length of feeding. And mom ended up pumping right breast 10 mins , left breast 20 mins due to the baby latching  On the right while she was pumping.  Mom and dad receptive to teaching and [facility].     Maternal Data    Feeding Feeding Type: Breast Fed Length of feed:  (baby still feeding at 15 minutes, swallows noted )  LATCH Score/Interventions Latch: Repeated attempts needed to sustain latch, nipple held in mouth throughout feeding, stimulation needed to elicit sucking reflex. Intervention(s): Skin to skin;Teach feeding cues  Audible Swallowing: A few with stimulation Intervention(s): Skin to skin;Hand expression  Type of Nipple: Everted at rest and after stimulation  Comfort (Breast/Nipple): Soft / non-tender     Hold (Positioning): Assistance needed to correctly position infant at breast and maintain latch. Intervention(s): Breastfeeding basics reviewed;Support Pillows;Position options;Skin to skin  LATCH Score: 7  Lactation Tools Discussed/Used Tools: Pump (#24 Flange is a good fit , mom comfortable ) Breast pump type: Double-Electric Breast Pump Pump Review: Setup, frequency, and cleaning   Consult Status Consult Status: Complete Date: 07/25/16 Follow-up type: In-patient    Matilde SprangMargaret Ann Heidi Lemay 07/24/2016, 12:11 PM

## 2016-07-24 NOTE — Progress Notes (Signed)
Subjective: Postpartum Day 2: Cesarean Delivery Patient reports incisional pain, tolerating PO, + flatus and no problems voiding.    Objective: Vital signs in last 24 hours: Temp:  [97.9 F (36.6 C)-98.5 F (36.9 C)] 98.5 F (36.9 C) (10/24 0500) Pulse Rate:  [68-74] 68 (10/24 0500) Resp:  [16-18] 18 (10/24 0500) BP: (85-99)/(51-57) 99/57 (10/24 0500) SpO2:  [97 %-98 %] 98 % (10/23 1508)  Physical Exam:  General: alert, cooperative, appears stated age and no distress Lochia: appropriate Uterine Fundus: firm Incision: scant old blood on dressing DVT Evaluation: No evidence of DVT seen on physical exam.   Recent Labs  07/21/16 1714 07/23/16 0554  HGB 10.1* 7.1*  HCT 31.1* 20.9*    Assessment/Plan: Status post Cesarean section. Doing well postoperatively. Baby still not feeding well.  Continue current care. IP circ possibly tomorrow  [place] Hay 07/24/2016, 10:45 AM

## 2016-07-24 NOTE — Lactation Note (Signed)
This note was copied from a baby's chart. Lactation Consultation Note  Patient Name: Phyllis [place] Flax UXLKG'MToday's Date: 07/24/2016 Reason for consult: Follow-up assessment;Other (Comment);Infant weight loss (7 % weight loss, Serum BIli at 40 hours - 8.7 )  Baby is 46 hours old and last fed at 0735 25 mins, and 0800 for 10 mins. Mom presently sleepy and LC spoke with dad.  Dad very excited baby fed this am and mentioned the baby fed well. Presently baby asleep also.  LC recommended due the baby  being sluggish with feedings with exception of 3 feedings by 3 hours , check the baby's diaper and  Placed STS. LC reviewed doc flow sheets , baby is picking up with feedings, Latch scores - 5-7  and mom also has been pumping. 3 wets , 4 stools.  2 spits - medium and large.  Dad aware to call on the nurses light for LC.     Maternal Data    Feeding Feeding Type: Breast Fed Length of feed: 10 min  LATCH Score/Interventions                Intervention(s): Breastfeeding basics reviewed     Lactation Tools Discussed/Used Tools: Pump Breast pump type: Double-Electric Breast Pump   Consult Status Consult Status: Follow-up Date: 07/24/16 Follow-up type: In-patient    Matilde SprangMargaret Ann Rim Thatch 07/24/2016, 10:37 AM

## 2016-07-25 ENCOUNTER — Ambulatory Visit: Payer: Self-pay

## 2016-07-25 MED ORDER — OXYCODONE-ACETAMINOPHEN 5-325 MG PO TABS: 1.0000 | ORAL_TABLET | ORAL | 0 refills | Status: DC | PRN

## 2016-07-25 MED ORDER — IBUPROFEN 600 MG PO TABS: 600.0000 mg | ORAL_TABLET | Freq: Four times a day (QID) | ORAL | 0 refills | Status: AC | PRN

## 2016-07-25 NOTE — Progress Notes (Signed)
Discharge instructions reviewed, all questions answered.  No equipment sent home with patient all belongings at bedside sent home with patient. Baby discharged into care of mother and father.  Escorted to main entrance with baby in carseat on cart by NT.

## 2016-07-25 NOTE — Lactation Note (Signed)
This note was copied from a baby's chart. Lactation Consultation Note  Patient Name: Phyllis [name] WUJWJ'XToday's Date: 07/25/2016 Reason for consult: Follow-up assessment;Infant weight loss Baby is 6769 hours old , re weighed per Dr. Excell Seltzerooper - 2930 g , 6-7.4 oz from  6-9.1 oz. @ consult baby awake and hubgry and breast fed both breast with depth , and multiple swallows. Mom comfortable and breast are filling.  Mom aware to post pumping for 10 -15 mins and to save milk for next feeding. Baby is for circ this am.  Mom aware the baby may be sleepy after wards from circ.  Sore nipple and engorgement prevention and tx reviewed.  Per mom will have a DEBP [employer] at home and used it with her 1st baby and was able to keep her milk supply up.  Mother informed of post-discharge support and given phone number to the lactation department, including services for phone call assistance; out-patient appointments; and breastfeeding support group. List of other breastfeeding resources in the community given in the handout. Encouraged mother to call for problems or concerns related to breastfeeding.  Maternal Data Has patient been taught Hand Expression?: Yes  Feeding Feeding Type: Breast Fed Length of feed: 6 min (swallows noted )  LATCH Score/Interventions Latch: Grasps breast easily, tongue down, lips flanged, rhythmical sucking. Intervention(s): Skin to skin;Teach feeding cues;Waking techniques  Audible Swallowing: Spontaneous and intermittent  Type of Nipple: Everted at rest and after stimulation  Comfort (Breast/Nipple): Soft / non-tender  Problem noted: Filling  Hold (Positioning): Assistance needed to correctly position infant at breast and maintain latch. Intervention(s): Breastfeeding basics reviewed;Support Pillows;Position options;Skin to skin  LATCH Score: 9  Lactation Tools Discussed/Used     Consult Status Consult Status: Follow-up Date: 07/25/16 Follow-up type:  In-patient    [name] 07/25/2016, 10:25 AM

## 2016-07-25 NOTE — Discharge Instructions (Signed)
Start taking your iron pills twice daily again when you get home Increase foods high in iron (red meats, green leafy vegetables, beans)  Cesarean Delivery, Care After Refer to this sheet in the next few weeks. These instructions provide you with information on caring for yourself after your procedure. Your health care provider may also give you specific instructions. Your treatment has been planned according to current medical practices, but problems sometimes occur. Call your health care provider if you have any problems or questions after you go home. HOME CARE INSTRUCTIONS  Only take over-the-counter or prescription medications as directed by your health care provider.  Do not drink alcohol, especially if you are breastfeeding or taking medication to relieve pain.  Do not chew or smoke tobacco.  Continue to use good perineal care. Good perineal care includes:  Wiping your perineum from front to back.  Keeping your perineum clean.  Check your surgical cut (incision) daily for increased redness, drainage, swelling, or separation of skin.  Clean your incision gently with soap and water every day, and then pat it dry. If your health care provider says it is okay, leave the incision uncovered. Use a bandage (dressing) if the incision is draining fluid or appears irritated. If the adhesive strips across the incision do not fall off within 7 days, carefully peel them off.  Hug a pillow when coughing or sneezing until your incision is healed. This helps to relieve pain.  Do not use tampons or douche until your health care provider says it is okay.  Shower, wash your hair, and take tub baths as directed by your health care provider.  Wear a well-fitting bra that provides breast support.  Limit wearing support panties or control-top hose.  Drink enough fluids to keep your urine clear or pale yellow.  Eat high-fiber foods such as whole grain cereals and breads, brown rice, beans, and  fresh fruits and vegetables every day. These foods may help prevent or relieve constipation.  Resume activities such as climbing stairs, driving, lifting, exercising, or traveling as directed by your health care provider.  Talk to your health care provider about resuming sexual activities. This is dependent upon your risk of infection, your rate of healing, and your comfort and desire to resume sexual activity.  Try to have someone help you with your household activities and your newborn for at least a few days after you leave the hospital.  Rest as much as possible. Try to rest or take a nap when your newborn is sleeping.  Increase your activities gradually.  Keep all of your scheduled postpartum appointments. It is very important to keep your scheduled follow-up appointments. At these appointments, your health care provider will be checking to make sure that you are healing physically and emotionally. SEEK MEDICAL CARE IF:   You are passing large clots from your vagina. Save any clots to show your health care provider.  You have a foul smelling discharge from your vagina.  You have trouble urinating.  You are urinating frequently.  You have pain when you urinate.  You have a change in your bowel movements.  You have increasing redness, pain, or swelling near your incision.  You have pus draining from your incision.  Your incision is separating.  You have painful, hard, or reddened breasts.  You have a severe headache.  You have blurred vision or see spots.  You feel sad or depressed.  You have thoughts of hurting yourself or your newborn.  You have  questions about your care, the care of your newborn, or medications.  You are dizzy or light-headed.  You have a rash.  You have pain, redness, or swelling at the site of the removed intravenous access (IV) tube.  You have nausea or vomiting.  You stopped breastfeeding and have not had a menstrual period within 12  weeks of stopping.  You are not breastfeeding and have not had a menstrual period within 12 weeks of delivery.  You have a fever. SEEK IMMEDIATE MEDICAL CARE IF:  You have persistent pain.  You have chest pain.  You have shortness of breath.  You faint.  You have leg pain.  You have stomach pain.  Your vaginal bleeding saturates 2 or more sanitary pads in 1 hour. MAKE SURE YOU:   Understand these instructions.  Will watch your condition.  Will get help right away if you are not doing well or get worse.   This information is not intended to replace advice given to you by your health care provider. Make sure you discuss any questions you have with your health care provider.   Document Released: 06/09/2002 Document Revised: 10/08/2014 Document Reviewed: 05/14/2012 Elsevier Interactive Patient Education [employer]   Breastfeeding Deciding to breastfeed is one of the best choices you can make for you and your baby. A change in hormones during pregnancy causes your breast tissue to grow and increases the number and size of your milk ducts. These hormones also allow proteins, sugars, and fats from your blood supply to make breast milk in your milk-producing glands. Hormones prevent breast milk from being released before your baby is born as well as prompt milk flow after birth. Once breastfeeding has begun, thoughts of your baby, as well as his or her sucking or crying, can stimulate the release of milk from your milk-producing glands.  BENEFITS OF BREASTFEEDING For Your Baby  Your first milk (colostrum) helps your baby's digestive system function better.  There are antibodies in your milk that help your baby fight off infections.  Your baby has a lower incidence of asthma, allergies, and sudden infant death syndrome.  The nutrients in breast milk are better for your baby than infant formulas and are designed uniquely for your baby's needs.  Breast milk improves your  baby's brain development.  Your baby is less likely to develop other conditions, such as childhood obesity, asthma, or type 2 diabetes mellitus. For You  Breastfeeding helps to create a very special bond between you and your baby.  Breastfeeding is convenient. Breast milk is always available at the correct temperature and costs nothing.  Breastfeeding helps to burn calories and helps you lose the weight gained during pregnancy.  Breastfeeding makes your uterus contract to its prepregnancy size faster and slows bleeding (lochia) after you give birth.   Breastfeeding helps to lower your risk of developing type 2 diabetes mellitus, osteoporosis, and breast or ovarian cancer later in life. SIGNS THAT YOUR BABY IS HUNGRY Early Signs of Hunger  Increased alertness or activity.  Stretching.  Movement of the head from side to side.  Movement of the head and opening of the mouth when the corner of the mouth or cheek is stroked (rooting).  Increased sucking sounds, smacking lips, cooing, sighing, or squeaking.  Hand-to-mouth movements.  Increased sucking of fingers or hands. Late Signs of Hunger  Fussing.  Intermittent crying. Extreme Signs of Hunger Signs of extreme hunger will require calming and consoling before your baby will be able  to breastfeed successfully. Do not wait for the following signs of extreme hunger to occur before you initiate breastfeeding:  Restlessness.  A loud, strong cry.  Screaming. BREASTFEEDING BASICS Breastfeeding Initiation  Find a comfortable place to sit or lie down, with your neck and back well supported.  Place a pillow or rolled up blanket under your baby to bring him or her to the level of your breast (if you are seated). Nursing pillows are specially designed to help support your arms and your baby while you breastfeed.  Make sure that your baby's abdomen is facing your abdomen.  Gently massage your breast. With your fingertips, massage  from your chest wall toward your nipple in a circular motion. This encourages milk flow. You may need to continue this action during the feeding if your milk flows slowly.  Support your breast with 4 fingers underneath and your thumb above your nipple. Make sure your fingers are well away from your nipple and your baby's mouth.  Stroke your baby's lips gently with your finger or nipple.  When your baby's mouth is open wide enough, quickly bring your baby to your breast, placing your entire nipple and as much of the colored area around your nipple (areola) as possible into your baby's mouth.  More areola should be visible above your baby's upper lip than below the lower lip.  Your baby's tongue should be between his or her lower gum and your breast.  Ensure that your baby's mouth is correctly positioned around your nipple (latched). Your baby's lips should create a seal on your breast and be turned out (everted).  It is common for your baby to suck about 2-3 minutes in order to start the flow of breast milk. Latching Teaching your baby how to latch on to your breast properly is very important. An improper latch can cause nipple pain and decreased milk supply for you and poor weight gain in your baby. Also, if your baby is not latched onto your nipple properly, he or she may swallow some air during feeding. This can make your baby fussy. Burping your baby when you switch breasts during the feeding can help to get rid of the air. However, teaching your baby to latch on properly is still the best way to prevent fussiness from swallowing air while breastfeeding. Signs that your baby has successfully latched on to your nipple:  Silent tugging or silent sucking, without causing you pain.  Swallowing heard between every 3-4 sucks.  Muscle movement above and in front of his or her ears while sucking. Signs that your baby has not successfully latched on to nipple:  Sucking sounds or smacking sounds  from your baby while breastfeeding.  Nipple pain. If you think your baby has not latched on correctly, slip your finger into the corner of your baby's mouth to break the suction and place it between your baby's gums. Attempt breastfeeding initiation again. Signs of Successful Breastfeeding Signs from your baby:  A gradual decrease in the number of sucks or complete cessation of sucking.  Falling asleep.  Relaxation of his or her body.  Retention of a small amount of milk in his or her mouth.  Letting go of your breast by himself or herself. Signs from you:  Breasts that have increased in firmness, weight, and size 1-3 hours after feeding.  Breasts that are softer immediately after breastfeeding.  Increased milk volume, as well as a change in milk consistency and color by the fifth day of  breastfeeding.  Nipples that are not sore, cracked, or bleeding. Signs That Your Pecola Leisure is Getting Enough Milk  Wetting at least 3 diapers in a 24-hour period. The urine should be clear and pale yellow by age 37 days.  At least 3 stools in a 24-hour period by age 37 days. The stool should be soft and yellow.  At least 3 stools in a 24-hour period by age 80 days. The stool should be seedy and yellow.  No loss of weight greater than 10% of birth weight during the first 67 days of age.  Average weight gain of 4-7 ounces (113-198 g) per week after age 63 days.  Consistent daily weight gain by age 37 days, without weight loss after the age of 2 weeks. After a feeding, your baby may spit up a small amount. This is common. BREASTFEEDING FREQUENCY AND DURATION Frequent feeding will help you make more milk and can prevent sore nipples and breast engorgement. Breastfeed when you feel the need to reduce the fullness of your breasts or when your baby shows signs of hunger. This is called "breastfeeding on demand." Avoid introducing a pacifier to your baby while you are working to establish breastfeeding (the  first 4-6 weeks after your baby is born). After this time you may choose to use a pacifier. Research has shown that pacifier use during the first year of a baby's life decreases the risk of sudden infant death syndrome (SIDS). Allow your baby to feed on each breast as long as he or she wants. Breastfeed until your baby is finished feeding. When your baby unlatches or falls asleep while feeding from the first breast, offer the second breast. Because newborns are often sleepy in the first few weeks of life, you may need to awaken your baby to get him or her to feed. Breastfeeding times will vary from baby to baby. However, the following rules can serve as a guide to help you ensure that your baby is properly fed:  Newborns (babies 29 weeks of age or younger) may breastfeed every 1-3 hours.  Newborns should not go longer than 3 hours during the day or 5 hours during the night without breastfeeding.  You should breastfeed your baby a minimum of 8 times in a 24-hour period until you begin to introduce solid foods to your baby at around 56 months of age. BREAST MILK PUMPING Pumping and storing breast milk allows you to ensure that your baby is exclusively fed your breast milk, even at times when you are unable to breastfeed. This is especially important if you are going back to work while you are still breastfeeding or when you are not able to be present during feedings. Your lactation consultant can give you guidelines on how long it is safe to store breast milk. A breast pump is a machine that allows you to pump milk from your breast into a sterile bottle. The pumped breast milk can then be stored in a refrigerator or freezer. Some breast pumps are operated by hand, while others use electricity. Ask your lactation consultant which type will work best for you. Breast pumps can be purchased, but some hospitals and breastfeeding support groups lease breast pumps on a monthly basis. A lactation consultant can teach  you how to hand express breast milk, if you prefer not to use a pump. CARING FOR YOUR BREASTS WHILE YOU BREASTFEED Nipples can become dry, cracked, and sore while breastfeeding. The following recommendations can help keep your breasts moisturized and  healthy:  Avoid using soap on your nipples.  Wear a supportive bra. Although not required, special nursing bras and tank tops are designed to allow access to your breasts for breastfeeding without taking off your entire bra or top. Avoid wearing underwire-style bras or extremely tight bras.  Air dry your nipples for 3-224minutes after each feeding.  Use only cotton bra pads to absorb leaked breast milk. Leaking of breast milk between feedings is normal.  Use lanolin on your nipples after breastfeeding. Lanolin helps to maintain your skin's normal moisture barrier. If you use pure lanolin, you do not need to wash it off before feeding your baby again. Pure lanolin is not toxic to your baby. You may also hand express a few drops of breast milk and gently massage that milk into your nipples and allow the milk to air dry. In the first few weeks after giving birth, some women experience extremely full breasts (engorgement). Engorgement can make your breasts feel heavy, warm, and tender to the touch. Engorgement peaks within 3-5 days after you give birth. The following recommendations can help ease engorgement:  Completely empty your breasts while breastfeeding or pumping. You may want to start by applying warm, moist heat (in the shower or with warm water-soaked hand towels) just before feeding or pumping. This increases circulation and helps the milk flow. If your baby does not completely empty your breasts while breastfeeding, pump any extra milk after he or she is finished.  Wear a snug bra (nursing or regular) or tank top for 1-2 days to signal your body to slightly decrease milk production.  Apply ice packs to your breasts, unless this is too  uncomfortable for you.  Make sure that your baby is latched on and positioned properly while breastfeeding. If engorgement persists after 48 hours of following these recommendations, contact your health care provider or a Advertising copywriterlactation consultant. OVERALL HEALTH CARE RECOMMENDATIONS WHILE BREASTFEEDING  Eat healthy foods. Alternate between meals and snacks, eating 3 of each per day. Because what you eat affects your breast milk, some of the foods may make your baby more irritable than usual. Avoid eating these foods if you are sure that they are negatively affecting your baby.  Drink milk, fruit juice, and water to satisfy your thirst (about 10 glasses a day).  Rest often, relax, and continue to take your prenatal vitamins to prevent fatigue, stress, and anemia.  Continue breast self-awareness checks.  Avoid chewing and smoking tobacco. Chemicals from cigarettes that pass into breast milk and exposure to secondhand smoke may harm your baby.  Avoid alcohol and drug use, including marijuana. Some medicines that may be harmful to your baby can pass through breast milk. It is important to ask your health care provider before taking any medicine, including all over-the-counter and prescription medicine as well as vitamin and herbal supplements. It is possible to become pregnant while breastfeeding. If birth control is desired, ask your health care provider about options that will be safe for your baby. SEEK MEDICAL CARE IF:  You feel like you want to stop breastfeeding or have become frustrated with breastfeeding.  You have painful breasts or nipples.  Your nipples are cracked or bleeding.  Your breasts are red, tender, or warm.  You have a swollen area on either breast.  You have a fever or chills.  You have nausea or vomiting.  You have drainage other than breast milk from your nipples.  Your breasts do not become full before feedings by the  fifth day after you give birth.  You feel  sad and depressed.  Your baby is too sleepy to eat well.  Your baby is having trouble sleeping.   Your baby is wetting less than 3 diapers in a 24-hour period.  Your baby has less than 3 stools in a 24-hour period.  Your baby's skin or the white part of his or her eyes becomes yellow.   Your baby is not gaining weight by 57 days of age. SEEK IMMEDIATE MEDICAL CARE IF:  Your baby is overly tired (lethargic) and does not want to wake up and feed.  Your baby develops an unexplained fever.   This information is not intended to replace advice given to you by your health care provider. Make sure you discuss any questions you have with your health care provider.   Document Released: 09/17/2005 Document Revised: 06/08/2015 Document Reviewed: 03/11/2013 Elsevier Interactive Patient Education [employer]

## 2016-07-25 NOTE — Discharge Summary (Signed)
OB Discharge Summary     Patient Name: Phyllis [name] DOB: May 10, 1986 MRN: [id number]  Date of admission: 07/21/2016 Delivering MD: Tilda BurrowFERGUSON, JOHN V   Date of discharge: 07/25/2016  Admitting diagnosis: 39 WKS, MUCUS PLUG CAME OUT, BLEEDING Intrauterine pregnancy: [id number]     Secondary diagnosis:  Active Problems:   Pregnancy  Additional problems: recurrent decels despite d/c of pitocin, amnioinfusion, terbutaline, c/s for decels/CPD     Discharge diagnosis: Term Pregnancy Delivered                                                                                                Post partum procedures:none  Augmentation: none  Complications: None  Hospital course:  Onset of Labor With Unplanned C/S  30 y.o. yo G2P2002 at 7098w0d was admitted in Active Labor on 07/21/2016. Patient had a labor course significant for recurrent decels despite d/c of pitocin/amnioinfusion/terbutaline, vts still high at -2, c/s performed d/t decels/CPD at 7cm. Membrane Rupture Time/Date: 2:40 AM ,07/22/2016   The patient went for cesarean section due to decels/CPD, and delivered a Viable infant,07/22/2016  Details of operation can be found in separate operative note. Patient had an uncomplicated postpartum course.  She is ambulating,tolerating a regular diet, passing flatus, and urinating well.  Patient is discharged home in stable condition 07/25/16.   Physical exam Vitals:   07/23/16 1508 07/24/16 0500 07/24/16 1755 07/25/16 0603  BP: (!) 90/51 (!) 99/57 (!) 98/58 106/62  Pulse: 68 68 71 64  Resp: 18 18 18 18   Temp: 98.4 F (36.9 C) 98.5 F (36.9 C) 98.2 F (36.8 C) 98 F (36.7 C)  TempSrc: Oral Oral Oral Oral  SpO2: 98%  99%   Weight:      Height:       General: alert, cooperative and no distress Lochia: appropriate Uterine Fundus: firm Incision: Healing well with no significant drainage, No significant erythema, Dressing is clean, dry, and intact, honeycomb DVT Evaluation: No evidence of  DVT seen on physical exam. Negative Homan's sign. No cords or calf tenderness. No significant calf/ankle edema. Labs: Lab Results  Component Value Date   WBC 7.8 07/23/2016   HGB 7.1 (L) 07/23/2016   HCT 20.9 (L) 07/23/2016   MCV 70.8 (L) 07/23/2016   PLT 146 (L) 07/23/2016   CMP Latest Ref Rng & Units 05/17/2016  Glucose 65 - 99 mg/dL 409(W134(H)    Discharge instruction: per After Visit Summary and "Baby and Me Booklet".  After visit meds:    Medication List    TAKE these medications   CONCEPT OB 130-92.4-1 MG Caps Take 1 capsule by mouth daily.   ibuprofen 600 MG tablet Commonly known as:  ADVIL,MOTRIN Take 1 tablet (600 mg total) by mouth every 6 (six) hours as needed for mild pain, moderate pain or cramping.   oxyCODONE-acetaminophen 5-325 MG tablet Commonly known as:  PERCOCET/ROXICET Take 1-2 tablets by mouth every 4 (four) hours as needed for moderate pain.       Diet: routine diet  Activity: Advance as tolerated. Pelvic rest for 6 weeks.   Outpatient follow JX:BJYNup:call  for pp visit in 4-6wks Follow up Appt:No future appointments. Follow up Visit:No Follow-up on file.  Postpartum contraception: abstinence until IUD  Newborn Data: Live born female  Birth Weight: 7 lb 5.3 oz (3325 g) APGAR: 9, 10  Baby Feeding: Breast Disposition:home with mother, needs inpatient circ prior to d/c   07/25/2016 Marge Duncans, CNM

## 2016-07-25 NOTE — Lactation Note (Signed)
This note was copied from a baby's chart. Lactation Consultation Note  Patient Name: Phyllis [place] Dettloff UJWJX'BToday's Date: 07/25/2016 Reason for consult: Follow-up assessment  2nd LC visit for this Dyad today. Baby sleepy post circ at 2 pm .  @ 2:30 pm , baby more awake, to sluggish to latch.  LC showed mom and dad how to finger feed with 23F  Feeding tube and baby took 6 ml of EBM well.  Attempted to latch, baby still sluggish , few sucks and fell  Asleep. LC recommended to mom when she gets home if baby is still sluggish post circ. Wake up enough to finger feed EBM or formula , and try to latch, if still sluggish  Bottle feed 30 ml of EBM if available or formula.  And post pump. LC stressed to mom and dad the importance of the supplementing due to weight loss.  And also post  pumping 10 -15 mins at least 5 times as day. While pumping avoid looking at pump pieces . ( post pumping the DEBP use be used for extra stimulation and the hand pump for pre- pump if needed.  Nutritive vs non - nutritive feeding patterns, importance of feeding  Every 2 - 3 hours and not to go over 3 hours. STS feedings until baby is staying awake for feeding.  Mother informed of post-discharge support and given phone number to the lactation department, including services for phone call assistance; out-patient appointments; and breastfeeding support group. List of other breastfeeding resources in the community given in the handout. Encouraged mother to call for problems or concerns related to breastfeeding.    Maternal Data Has patient been taught Hand Expression?: Yes  Feeding Feeding Type: Breast Fed  LATCH Score/Interventions Latch: Repeated attempts needed to sustain latch, nipple held in mouth throughout feeding, stimulation needed to elicit sucking reflex.  Audible Swallowing: None  Type of Nipple: Everted at rest and after stimulation  Comfort (Breast/Nipple): Filling, red/small blisters or bruises, mild/mod  discomfort     Hold (Positioning): Assistance needed to correctly position infant at breast and maintain latch. Intervention(s): Breastfeeding basics reviewed;Skin to skin  LATCH Score: 5  Lactation Tools Discussed/Used Tools: 23F feeding tube / Syringe   Consult Status Consult Status: Complete Date: 07/25/16 Follow-up type: In-patient    Phyllis [name] 07/25/2016, 4:18 PM

## 2016-07-26 ENCOUNTER — Encounter: Payer: BLUE CROSS/BLUE SHIELD | Admitting: Obstetrics & Gynecology

## 2016-07-26 ENCOUNTER — Encounter: Payer: Self-pay | Admitting: *Deleted

## 2016-08-14 NOTE — Brief Op Note (Signed)
07/21/2016 - 07/22/2016  9:53 PM  PATIENT:  [place]  30 y.o. female  PRE-OPERATIVE DIAGNOSIS:  CPD, Fetal Intolerance to Labor  POST-OPERATIVE DIAGNOSIS:  CPD, Fetal Intolerance to Labor  PROCEDURE:  Procedure(s): CESAREAN SECTION (N/A)  SURGEON:  Surgeon(s) and Role:     * Tilda BurrowJohn V Lopez Dentinger, MD  PHYSICIAN ASSISTANT:   ASSISTANTS: none   ANESTHESIA:   epidural  EBL:  No intake/output data recorded.  BLOOD ADMINISTERED:none  DRAINS: Urinary Catheter (Foley)   LOCAL MEDICATIONS USED:  NONE  SPECIMEN:  No Specimen  DISPOSITION OF SPECIMEN:  placenta to L&D  COUNTS:  YES  TOURNIQUET:  * No tourniquets in log *  DICTATION: .Dragon Dictation  PLAN OF CARE: Discharge to home after PACU  PATIENT DISPOSITION:  PACU - hemodynamically stable.   Delay start of Pharmacological VTE agent (>24hrs) due to surgical blood loss or risk of bleeding: not applicable

## 2016-08-14 NOTE — Op Note (Signed)
0/21/2017 - 07/22/2016  9:53 PM  PATIENT:  Phyllis [name]  30 y.o. female  PRE-OPERATIVE DIAGNOSIS:  CPD, Fetal Intolerance to Labor  POST-OPERATIVE DIAGNOSIS:  CPD, Fetal Intolerance to Labor  PROCEDURE:  Procedure(s): CESAREAN SECTION (N/A)  SURGEON:  Surgeon(s) and Role:    This note was dictated elsewhere on 08/15/2016 inches a part of the record at this time

## 2016-08-14 NOTE — Brief Op Note (Signed)
07/21/2016 - 07/22/2016  9:54 PM  PATIENT:  [place] Gick  30 y.o. female  PRE-OPERATIVE DIAGNOSIS:  CPD, Fetal Intolerance to Labor  POST-OPERATIVE DIAGNOSIS:  CPD, Fetal Intolerance to Labor  PROCEDURE:  Procedure(s): CESAREAN SECTION (N/A)  SURGEON:  Surgeon(s) and Role:     * Tilda BurrowJohn V Azarria Balint, MD  PHYSICIAN ASSISTANT:   ASSISTANTS: none   ANESTHESIA:   epidural  EBL:  No intake/output data recorded.  BLOOD ADMINISTERED:none  DRAINS: Urinary Catheter (Foley)   LOCAL MEDICATIONS USED:  NONE  SPECIMEN:  Source of Specimen:  Placenta to labor and delivery  DISPOSITION OF SPECIMEN:  Labor and delivery  COUNTS:  YES  TOURNIQUET:  * No tourniquets in log *  DICTATION: .Dragon Dictation  PLAN OF CARE: Patient has active admission orders  PATIENT DISPOSITION:  PACU - hemodynamically stable.   Delay start of Pharmacological VTE agent (>24hrs) due to surgical blood loss or risk of bleeding: not applicable Details of procedure. Patient was taken to the operating room after confirmation of nonreassuring fetal status. The baby had recovered. Epidural was topped up, analgesia confirmed timeout conducted and antibiotics administered preoperatively. Transverse lower abdominal incision was performed in standard method of Pfannenstiel with transverse abdominal incision, midline entry of the peritoneal cavity after transverse fascial incision. Alexis wound retractor was positioned and transverse uterine incision made down to the baby with extension transversely with the index finger traction then cephalad and caudad allowing fetal vertex to be rotated into the incision and delivered without further difficulty. Cord was clamped after 1 minute weight. Baby was healthy please see pediatric notes. The placenta was delivered intact Tomasa BlaseSchultz presentation and sent for labor and delivery monitoring until the baby well-being was confirmed. As was irrigated, swabbed out mine and then the uterus  closed in 2 layer fashion, consisting of running locking first layer was 0 Monocryl followed by continuous running second layer. Hemostasis was satisfactory. His irrigated, anterior peritoneum closed with 2-0 Vicryl, fascia closed with running 0 Vicryl, subcutaneous tissues confirmed as hemostatic and reapproximated with 3 interrupted horizontal mattress sutures of 20 plain followed by subcuticular 4-0 Vicryl closure the skin sponge and needle counts were correct patient to recovery room in stable condition

## 2016-08-15 NOTE — Op Note (Signed)
Please see the brief operative note for surgical details documented there.

## 2016-08-22 ENCOUNTER — Encounter: Payer: Self-pay | Admitting: Obstetrics & Gynecology

## 2016-08-22 ENCOUNTER — Ambulatory Visit (INDEPENDENT_AMBULATORY_CARE_PROVIDER_SITE_OTHER): Payer: BLUE CROSS/BLUE SHIELD | Admitting: Obstetrics & Gynecology

## 2016-08-22 MED ORDER — NORETHINDRONE 0.35 MG PO TABS: 1.0000 | ORAL_TABLET | Freq: Every day | ORAL | 11 refills | Status: AC

## 2016-08-22 NOTE — Progress Notes (Signed)
Subjective:     Phyllis [name] is a 30 y.o. female who presents for a postpartum visit. She is 4 weeks postpartum following a low cervical transverse Cesarean section. I have fully reviewed the prenatal and intrapartum course. The delivery was at 39 gestational weeks. Outcome: primary cesarean section, low transverse incision. Anesthesia: epidural. Postpartum course has been normal. Baby's course has been normal. Baby is feeding by breast. Bleeding no bleeding. Bowel function is normal. Bladder function is normal. Patient is not sexually active. Contraception method is abstinence. Postpartum depression screening: negative.  The following portions of the patient's history were reviewed and updated as appropriate: allergies, current medications, past family history, past medical history, past social history, past surgical history and problem list.  Review of Systems Pertinent items are noted in HPI.   Objective:    BP 108/69   Pulse 68   Wt 108 lb (49 kg)   Breastfeeding? Yes   BMI 19.13 kg/m   General:  alert   Breasts:  inspection negative, no nipple discharge or bleeding, no masses or nodularity palpable  Lungs: clear to auscultation bilaterally  Heart:  regular rate and rhythm, S1, S2 normal, no murmur, click, rub or gallop  Abdomen: soft, non-tender; bowel sounds normal; no masses,  no organomegaly   Vulva:  not evaluated  Vagina: not evaluated  Cervix:  not evaluated  Corpus: not examined  Adnexa:  not evaluated  Rectal Exam: Not performed.        Assessment:   Normal postpartum exam. Pap smear not done at today's visit.   Plan:    1. Contraception: oral progesterone-only contraceptive, discussed its efficacy rate, and specifics of use, rec back up for 30 days.Offered Mirena

## 2016-09-03 ENCOUNTER — Encounter: Payer: Self-pay | Admitting: Obstetrics & Gynecology

## 2017-07-30 IMAGING — US US MFM FETAL BPP W/O NON-STRESS
1 series · 13 of 24 positions shown · non-contrast
Comparison: none

[Series 1: us mfm fetal bpp w/o non-stress · 24 acquisitions, 13 frames shown]
[im 1/24]
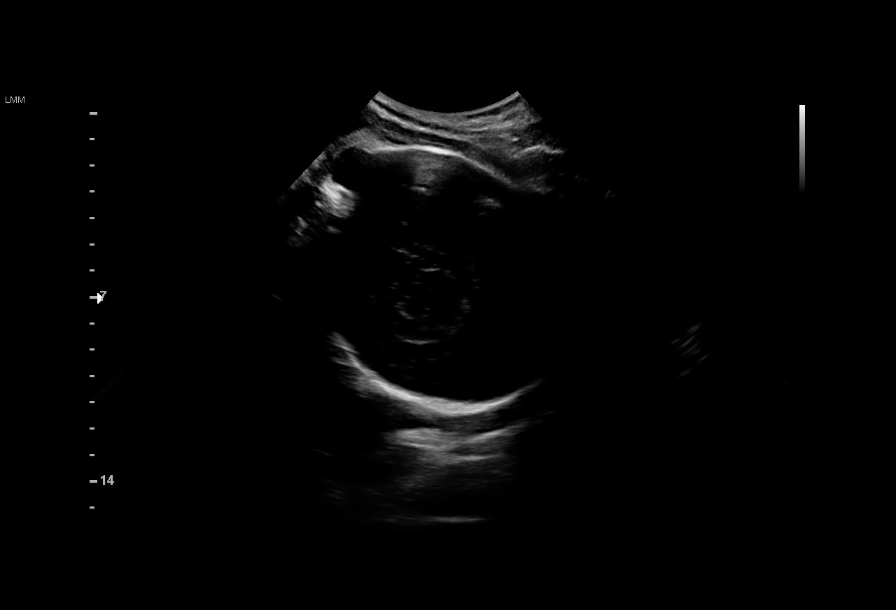
[im 3/24]
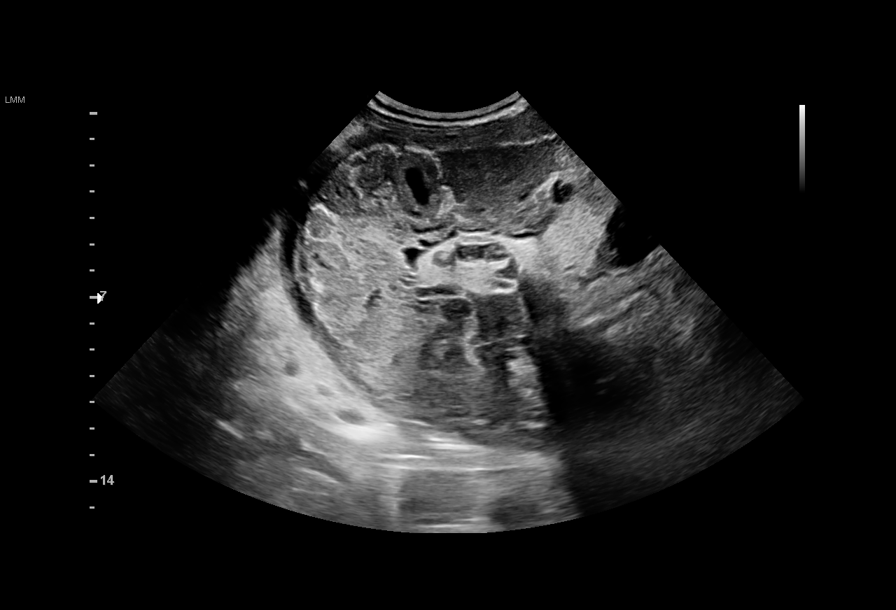
[im 5/24]
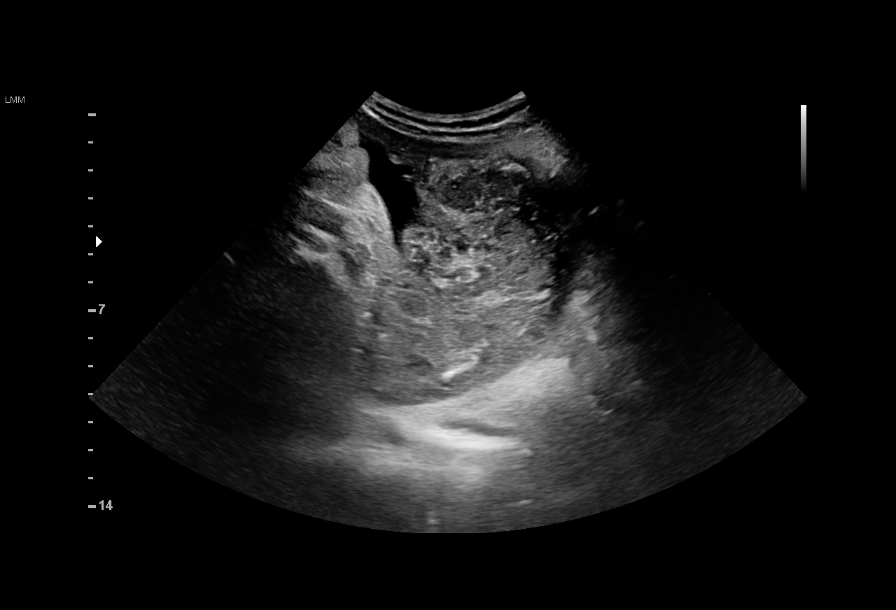
[im 7/24]
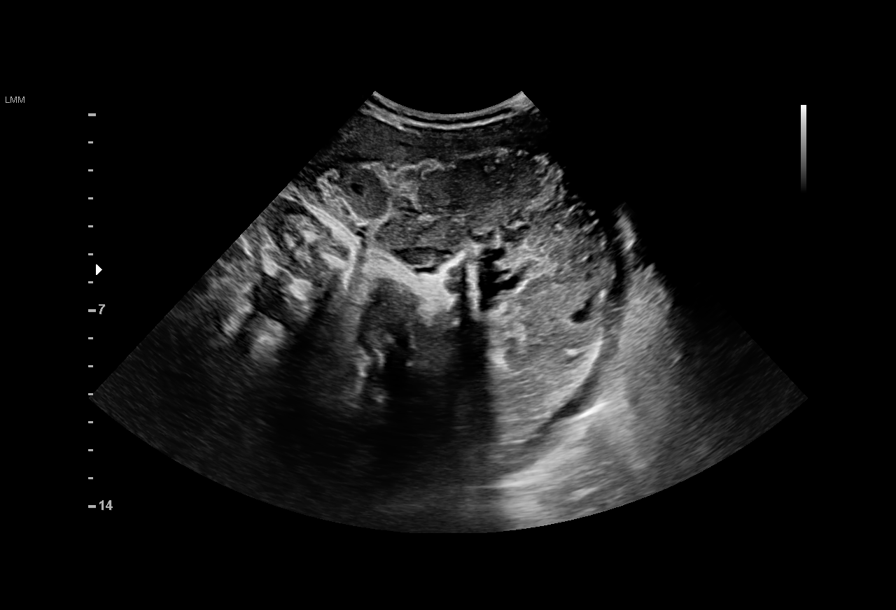
[im 9/24]
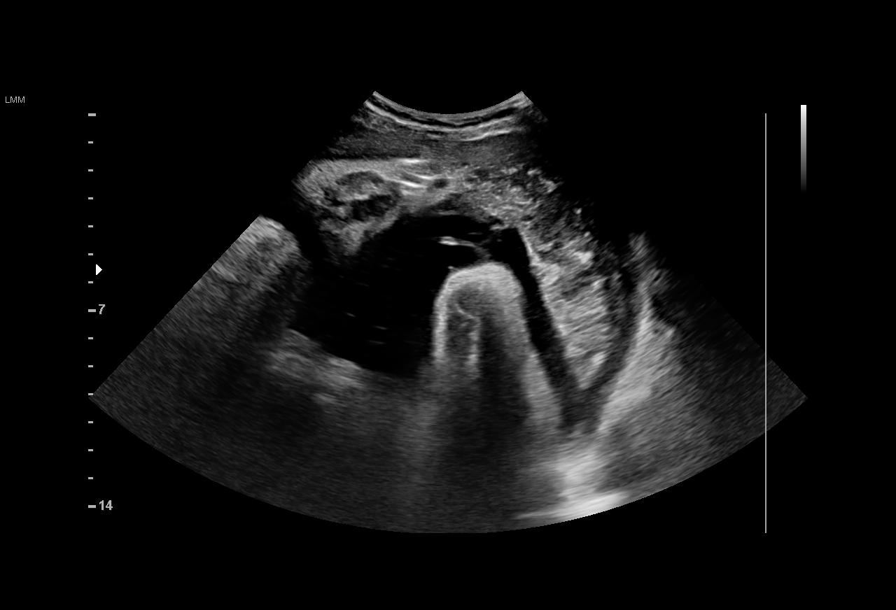
[im 11/24]
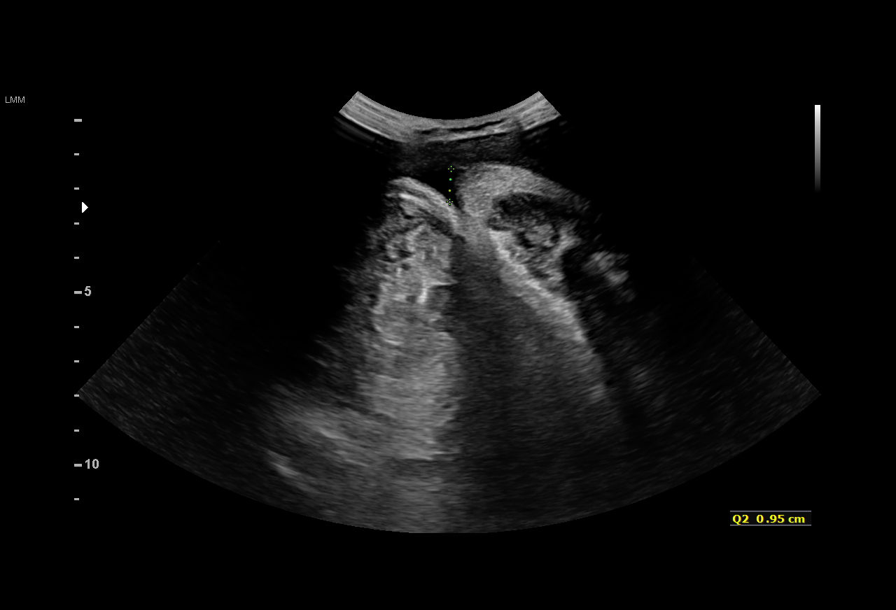
[im 13/24]
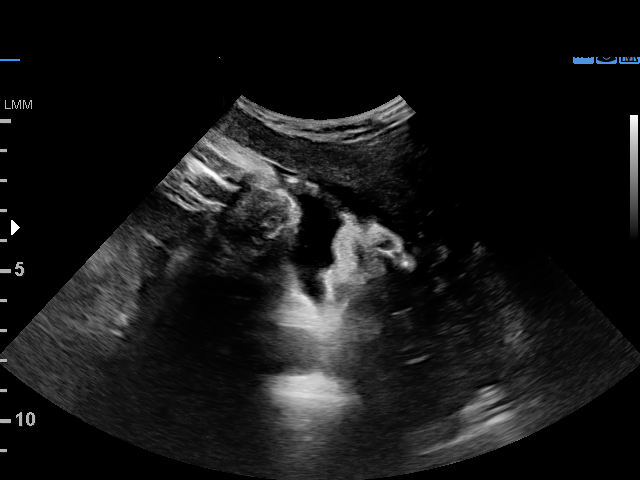
[im 14/24]
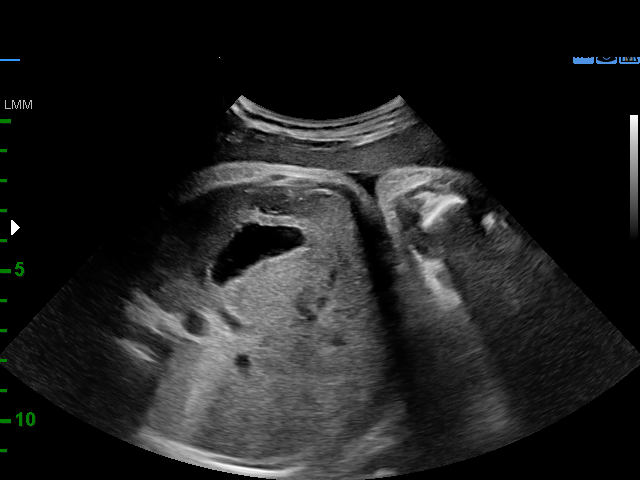
[im 16/24]
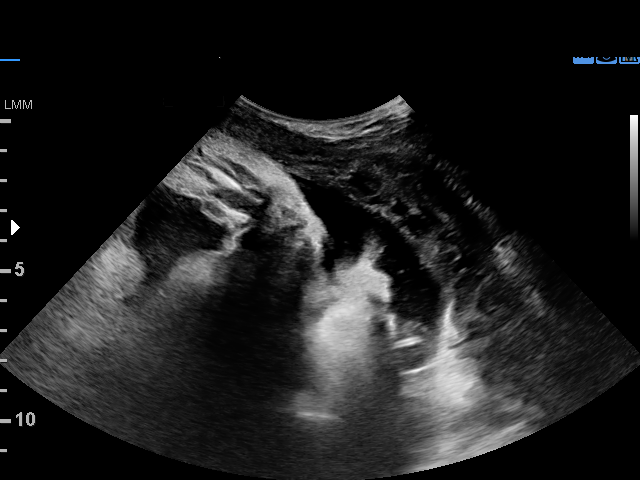
[im 18/24]
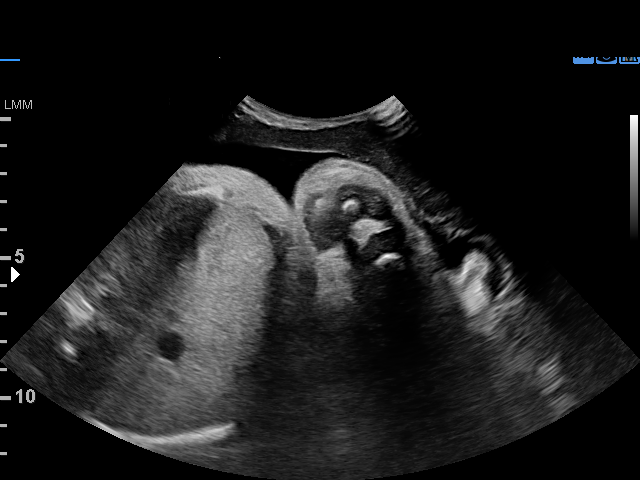
[im 20/24]
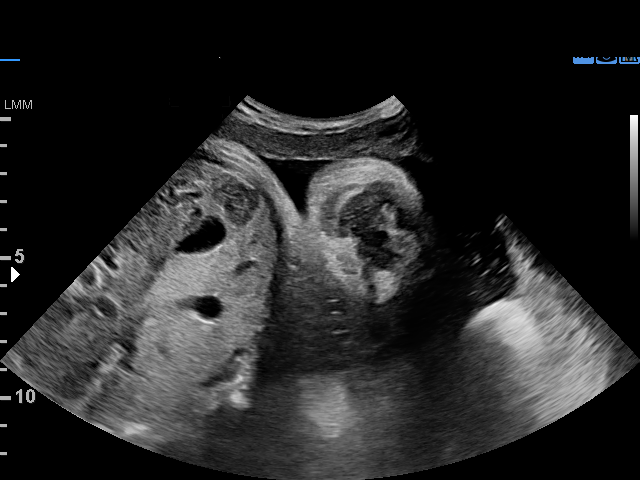
[im 22/24]
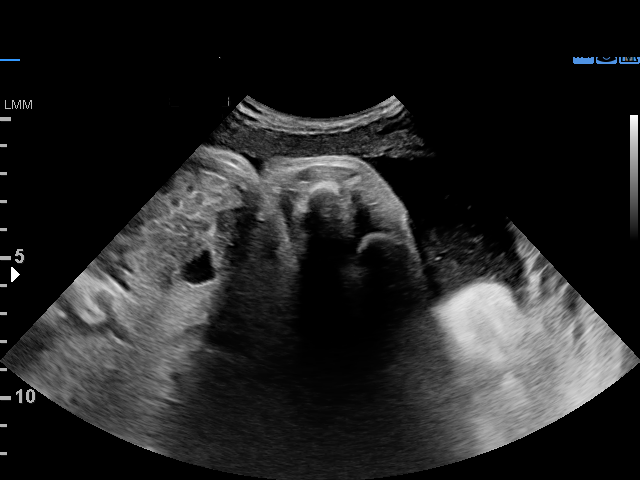
[im 24/24]
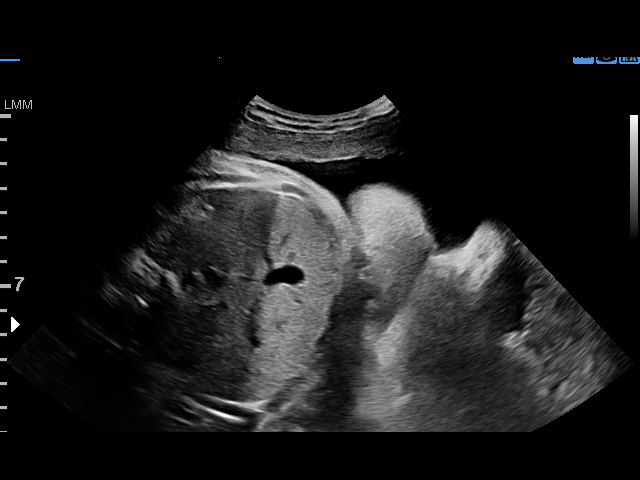

[13 of 24 positions shown; findings below may reference images not displayed]

MAU/Triage

1  AUNTYJATTY DELOWR          [id number]      [id number]     [id number]
Indications

38 weeks gestation of pregnancy
Non-reactive NST (variables and
decelerations)
OB History

Gravidity:    2         Term:   1        Prem:   0        SAB:   0
TOP:          0       Ectopic:  0        Living: 1
Fetal Evaluation

Num Of Fetuses:     1
Fetal Heart         142
Rate(bpm):
Cardiac Activity:   Observed
Presentation:       Cephalic
Placenta:           Fundal left, above cervical os

Amniotic Fluid
AFI FV:      Subjectively within normal limits

AFI Sum(cm)     %Tile       Largest Pocket(cm)
11.16           37

RUQ(cm)       RLQ(cm)       LUQ(cm)        LLQ(cm)
3.6
Biophysical Evaluation

Amniotic F.V:   Pocket => 2 cm two         F. Tone:        Observed
planes
F. Movement:    Observed                   Score:          [DATE]
F. Breathing:   Observed
Gestational Age

LMP:           38w 6d       Date:   10/23/15                 EDD:   07/29/16
Best:          38w 6d    Det. By:   LMP  (10/23/15)          EDD:   07/29/16
Impression

IUP at 38+6 weeks with nonreactive NST
AFI normal at 11.2 cm
BPP [DATE]
Recommendations

Reassuring fetal testing Follow-up ultrasounds as clinically
indicated.
